# Patient Record
Sex: Female | Born: 1945 | Race: White | Hispanic: No | Smoking: Former smoker
Health system: Southern US, Community
[De-identification: ages and names within clinical notes are randomized; demographics above are authoritative.]

## PROBLEM LIST (undated history)

## (undated) DIAGNOSIS — M4800 Spinal stenosis, site unspecified: Secondary | ICD-10-CM

## (undated) DIAGNOSIS — J449 Chronic obstructive pulmonary disease, unspecified: Secondary | ICD-10-CM

## (undated) DIAGNOSIS — M1990 Unspecified osteoarthritis, unspecified site: Secondary | ICD-10-CM

## (undated) DIAGNOSIS — F329 Major depressive disorder, single episode, unspecified: Secondary | ICD-10-CM

## (undated) DIAGNOSIS — G609 Hereditary and idiopathic neuropathy, unspecified: Secondary | ICD-10-CM

## (undated) DIAGNOSIS — C801 Malignant (primary) neoplasm, unspecified: Secondary | ICD-10-CM

## (undated) DIAGNOSIS — F32A Depression, unspecified: Secondary | ICD-10-CM

## (undated) DIAGNOSIS — I10 Essential (primary) hypertension: Secondary | ICD-10-CM

## (undated) HISTORY — DX: Hereditary and idiopathic neuropathy, unspecified: G60.9

## (undated) HISTORY — DX: Spinal stenosis, site unspecified: M48.00

## (undated) HISTORY — DX: Chronic obstructive pulmonary disease, unspecified: J44.9

## (undated) HISTORY — PX: COSMETIC SURGERY: SHX468

## (undated) HISTORY — DX: Malignant (primary) neoplasm, unspecified: C80.1

## (undated) HISTORY — DX: Essential (primary) hypertension: I10

## (undated) HISTORY — DX: Unspecified osteoarthritis, unspecified site: M19.90

---

## 1998-04-13 ENCOUNTER — Other Ambulatory Visit: Admission: RE | Admit: 1998-04-13 | Discharge: 1998-04-13 | Payer: Self-pay | Admitting: *Deleted

## 1998-04-22 ENCOUNTER — Other Ambulatory Visit: Admission: RE | Admit: 1998-04-22 | Discharge: 1998-04-22 | Payer: Self-pay | Admitting: *Deleted

## 1998-10-18 ENCOUNTER — Ambulatory Visit (HOSPITAL_COMMUNITY): Admission: RE | Admit: 1998-10-18 | Discharge: 1998-10-18 | Payer: Self-pay | Admitting: *Deleted

## 1999-06-13 ENCOUNTER — Other Ambulatory Visit: Admission: RE | Admit: 1999-06-13 | Discharge: 1999-06-13 | Payer: Self-pay | Admitting: *Deleted

## 1999-11-30 ENCOUNTER — Encounter: Payer: Self-pay | Admitting: *Deleted

## 1999-11-30 ENCOUNTER — Ambulatory Visit (HOSPITAL_COMMUNITY): Admission: RE | Admit: 1999-11-30 | Discharge: 1999-11-30 | Payer: Self-pay | Admitting: *Deleted

## 2000-12-05 ENCOUNTER — Ambulatory Visit (HOSPITAL_COMMUNITY): Admission: RE | Admit: 2000-12-05 | Discharge: 2000-12-05 | Payer: Self-pay | Admitting: *Deleted

## 2000-12-12 ENCOUNTER — Encounter: Payer: Self-pay | Admitting: Family Medicine

## 2000-12-12 ENCOUNTER — Ambulatory Visit (HOSPITAL_COMMUNITY): Admission: RE | Admit: 2000-12-12 | Discharge: 2000-12-12 | Payer: Self-pay | Admitting: Family Medicine

## 2002-01-08 ENCOUNTER — Encounter: Payer: Self-pay | Admitting: Family Medicine

## 2002-01-08 ENCOUNTER — Ambulatory Visit (HOSPITAL_COMMUNITY): Admission: RE | Admit: 2002-01-08 | Discharge: 2002-01-08 | Payer: Self-pay | Admitting: Family Medicine

## 2002-03-07 ENCOUNTER — Encounter: Payer: Self-pay | Admitting: Family Medicine

## 2002-03-07 ENCOUNTER — Ambulatory Visit (HOSPITAL_COMMUNITY): Admission: RE | Admit: 2002-03-07 | Discharge: 2002-03-07 | Payer: Self-pay | Admitting: Family Medicine

## 2003-02-03 ENCOUNTER — Ambulatory Visit (HOSPITAL_COMMUNITY): Admission: RE | Admit: 2003-02-03 | Discharge: 2003-02-03 | Payer: Self-pay | Admitting: Family Medicine

## 2003-02-03 ENCOUNTER — Encounter: Payer: Self-pay | Admitting: Family Medicine

## 2003-11-02 ENCOUNTER — Other Ambulatory Visit: Admission: RE | Admit: 2003-11-02 | Discharge: 2003-11-02 | Payer: Self-pay | Admitting: Internal Medicine

## 2004-04-04 ENCOUNTER — Inpatient Hospital Stay (HOSPITAL_COMMUNITY): Admission: EM | Admit: 2004-04-04 | Discharge: 2004-04-06 | Payer: Self-pay | Admitting: Emergency Medicine

## 2004-04-19 ENCOUNTER — Other Ambulatory Visit: Admission: RE | Admit: 2004-04-19 | Discharge: 2004-04-19 | Payer: Self-pay | Admitting: Obstetrics and Gynecology

## 2004-04-21 ENCOUNTER — Ambulatory Visit (HOSPITAL_COMMUNITY): Admission: RE | Admit: 2004-04-21 | Discharge: 2004-04-21 | Payer: Self-pay | Admitting: Family Medicine

## 2005-01-17 ENCOUNTER — Ambulatory Visit: Payer: Self-pay | Admitting: Internal Medicine

## 2005-07-07 ENCOUNTER — Ambulatory Visit (HOSPITAL_COMMUNITY): Admission: RE | Admit: 2005-07-07 | Discharge: 2005-07-07 | Payer: Self-pay | Admitting: Family Medicine

## 2005-08-01 ENCOUNTER — Encounter (HOSPITAL_COMMUNITY): Admission: RE | Admit: 2005-08-01 | Discharge: 2005-10-30 | Payer: Self-pay | Admitting: Internal Medicine

## 2006-06-18 ENCOUNTER — Ambulatory Visit: Payer: Self-pay | Admitting: Internal Medicine

## 2006-10-04 ENCOUNTER — Encounter: Admission: RE | Admit: 2006-10-04 | Discharge: 2006-10-04 | Payer: Self-pay | Admitting: Family Medicine

## 2007-08-28 ENCOUNTER — Ambulatory Visit: Payer: Self-pay | Admitting: Vascular Surgery

## 2008-04-09 ENCOUNTER — Ambulatory Visit (HOSPITAL_COMMUNITY): Admission: RE | Admit: 2008-04-09 | Discharge: 2008-04-09 | Payer: Self-pay | Admitting: Family Medicine

## 2009-07-12 DIAGNOSIS — J441 Chronic obstructive pulmonary disease with (acute) exacerbation: Secondary | ICD-10-CM | POA: Insufficient documentation

## 2009-07-12 DIAGNOSIS — I10 Essential (primary) hypertension: Secondary | ICD-10-CM | POA: Insufficient documentation

## 2009-07-13 ENCOUNTER — Ambulatory Visit: Payer: Self-pay | Admitting: Internal Medicine

## 2009-07-13 DIAGNOSIS — R635 Abnormal weight gain: Secondary | ICD-10-CM | POA: Insufficient documentation

## 2010-05-11 ENCOUNTER — Ambulatory Visit (HOSPITAL_COMMUNITY): Admission: RE | Admit: 2010-05-11 | Discharge: 2010-05-11 | Payer: Self-pay | Admitting: Family Medicine

## 2010-08-14 ENCOUNTER — Encounter: Payer: Self-pay | Admitting: Family Medicine

## 2010-11-16 ENCOUNTER — Ambulatory Visit (HOSPITAL_COMMUNITY)
Admission: RE | Admit: 2010-11-16 | Discharge: 2010-11-16 | Disposition: A | Discharge status: Home / Self Care | Payer: Medicare Other | Source: Ambulatory Visit | Attending: Family Medicine | Admitting: Family Medicine

## 2010-11-16 ENCOUNTER — Ambulatory Visit (HOSPITAL_COMMUNITY): Payer: Medicare Other

## 2010-11-16 DIAGNOSIS — R209 Unspecified disturbances of skin sensation: Secondary | ICD-10-CM | POA: Insufficient documentation

## 2010-11-16 DIAGNOSIS — M79609 Pain in unspecified limb: Secondary | ICD-10-CM

## 2010-12-06 NOTE — Procedures (Signed)
DUPLEX DEEP VENOUS EXAM - LOWER EXTREMITY   INDICATION:  Left calf edema and pain.   HISTORY:  Edema:  One day of swelling and heat in the left calf.  Trauma/Surgery:  No.  Pain:  One month of left calf pain.  PE:  No.  Previous DVT:  No.  Anticoagulants:  Aspirin daily.  Other:  Bilateral lower extremity varicose veins.  Patient was recently  diagnosed with a bone spur in the left leg.   DUPLEX EXAM:                CFV   SFV   PopV  PTV    GSV                R  L  R  L  R  L  R   L  R  L  Thrombosis    O  o     o     o      o     o  Spontaneous   +  +     +     +      +     +  Phasic        +  +     +     +      +     +  Augmentation  +  +     +     +      +     +  Compressible  +  +     +     +      +     +  Competent     0  +     +     +      +     +   Legend:  + - yes  o - no  p - partial  D - decreased   IMPRESSION:  1. No evidence of left lower extremity deep venous thrombosis,      superficial vein thrombosis, or baker's cyst.  2. 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    _____________________________  Larina Earthly, M.D.   MC/MEDQ  D:  08/28/2007  T:  08/29/2007  Job:  [id number]

## 2010-12-09 NOTE — Assessment & Plan Note (Signed)
[facility]                             PULMONARY OFFICE NOTE   NAME:Frye, Susan SHELLHAMMER                       MRN:          [id number]  DATE:06/18/2006                            DOB:          19-Dec-1945    HISTORY:  This is a 65 year old white female who quit smoking  successfully in September of 2005 but has not been seen here in several  years.  Her baseline is that she is able to walk up a flight of steps  before having to stop at the top due to dyspnea, but is able to walk  flat okay despite the fact that she has gained almost 40 pounds since  she stopped smoking.  She comes in today because she is acutely worse  over the last 3 days with increasing dyspnea just walking on a flat  surface, and states she is back to wheeze and a hacking cough productive  of yellow sputum, a scratchy throat, but no fever, chills, pleuritic or  exertional chest pain, orthopnea, PND, or leg swelling.   For full inventory of medications, please see face sheet dated June 18, 2006.   PHYSICAL EXAMINATION:  She is a pleasant, ambulatory white female in no  acute distress.  She is quite hoarse.  VITAL SIGNS:  Stable vital signs.  HEENT:  Unremarkable.  Oropharynx is clear except for minimal  cobblestoning.  No significant erythema, purulent post-nasal drainage.  NECK:  Supple without cervical adenopathy or tenderness. Trachea is  midline.  No thyromegaly.  LUNGS:  Fields reveal more upper than lower airway wheezing.  CARDIAC:  Regular rate and rhythm without murmur, gallop, or rub.  ABDOMEN:  Soft and benign.  EXTREMITIES:  Warm without calf tenderness, cyanosis, clubbing, or  edema.   Heme saturation 93% on room air.   She tells me she had a chest x-ray within the last 3 months at  [facility], which was felt to be normal.   IMPRESSION:  1. Chronic obstructive pulmonary disease status post smoking cessation      successful 2 years ago with  progressive weight gain that is      probably more to do with why she has noticed decreased exercise      tolerance than actual progression of air flow obstruction,      especially since she is on Advair, which should have eliminated any      asthmatic component or progression in terms of the inflammatory      component of her problem.  I have asked her to return within 6      weeks for PFTs to compare to previous studies to see if anything      else needs to be done.  2. To treat her acute exacerbation today, which is apparently due to      tracheobronchitis with asthmatic bronchitis, I have recommended a 6      day course with prednisone, along with a 7 day course of      Doxycycline, and reminded her how to use her p.r.n. Combivent 2  puffs every 4 hours.  For now, I would like her to stay on Advair.      I did ask her to add Zegerid at bedtime because of all the      hoarseness that she is having, and reminded her regarding optimal      Advair technique to reduce deposition to the upper airway.  3. For cough, I would like her to use Mucinex DM 1 to 2 every 12      hours, and for severe cough, to add tramadol 50 mg 1 q.4 p.r.n. to      the Advair.     Charlaine Dalton. Sherene Sires, MD, [facility]  Electronically Signed    MBW/MedQ  DD: 06/18/2006  DT: 06/18/2006  Job #: [id number]   cc:   Marjory Lies, M.D.

## 2010-12-09 NOTE — Cardiovascular Report (Signed)
[facility]  Patient:    Susan Frye, Susan Frye                         MRN: [id number] Adm. Date:  [id number] Attending:  Glennon Hamilton CC:         Charlaine Dalton. Sherene Sires, M.D. [facility]  Luis Abed, M.D. [facility]   Cardiac Catheterization  INDICATIONS:  The patient is a 65 year old white female with multiple risk factors including family history and cigarettes presenting with unstable angina.  PROCEDURE:  Selective coronary angiography, left ventricular angiography-Judkins technique.  RESULTS:  LV systolic 145, diastolic 25.  AO systolic 145, diastolic 73.  ANGIOGRAPHY: 1. The left main coronary artery is normal. 2. The left anterior descending artery had mild calcium proximally with some    irregularities proximally with a 30% lesion in the mid LAD. 3. The circumflex is normal. 4. The right coronary artery is normal. 5. The left ventricle reveals normal EF.  There is focal hypokinesis in the    mid inferior wall.  SUMMARY: 1. There is no significant coronary artery disease.  There is some calcium and    a lesion of 30% or less in the mid LAD. 2. The circumflex and RCA are normal. 3. The LV is normal although there is a focal area of mild hypokinesis of the    mid inferior wall.  The patient will be continued on a medical regimen. DD:  12/05/00 TD:  12/05/00 Job: [id number]

## 2010-12-09 NOTE — Discharge Summary (Signed)
NAME:  Susan Frye, Susan Frye                          ACCOUNT NO.:  [id number]   MEDICAL RECORD NO.:  [id number]                   PATIENT TYPE:  INP   LOCATION:  5737                                 FACILITY:  [facility]   PHYSICIAN:  Rosanne Sack, M.D.         DATE OF BIRTH:  Aug 23, 1945   DATE OF ADMISSION:  04/03/2004  DATE OF DISCHARGE:  04/06/2004                                 DISCHARGE SUMMARY   CHIEF COMPLAINT:  Shortness of breath.   DISCHARGE DIAGNOSES:  1.  Bilateral community acquired pneumonia greatest right upper lobe with      acute exacerbation of her chronic obstructive pulmonary disease.      1.  Acute exacerbation chronic obstructive pulmonary disease with          hypoxemia, leukocytosis, complaints shortness of breath.      2.  CT scan of the chest done April 04, 2004 noted stable right          middle lobe calcified nodule.  CT suggested bilateral pneumonia          greatest right upper lobe.      3.  Leukocytosis improving prior to discharge 13.3 in spite steroid          taper.      4.  Nasal cannula oxygen requirement resolved.  O2 saturations 93% on          room air prior to discharge.      5.  Wheezing resolved, steroid taper.      6.  Antibiotic therapy continued total 14 day course post discharge.      7.  Previously seen in consult, Dr. Sherene Sires, pulmonology.  2.  Tobacco abuse smoking one pack per day lifelong.      1.  Initiated on nicotine patch with patient to continue post discharge          and wean per manufacturer's instructions.      2.  Smoking cessation consult requested, but unfortunately not obtained          prior to discharge.      3.  Follow up at [facility] in regards to smoking          cessation.  3.  Chronic obstructive pulmonary disease with likely reactive component.      1.  History childhood asthma.      2.  Initiated on steroid taper over the course of hospitalization          secondary to wheezing with  resolution prior to discharge.      3.  Initiated on Advair Diskus for home use.  4.  Mild hyperglycemia thought secondary to prednisone steroid taper.      Glucose 125-159 range.      1.  Positive family history of diabetes.      2.  No indication of active diabetes in this patient.  Hemoglobin A1C  5.9.  5.  Borderline hypertension.      1.  Felt likely exacerbated by steroids.      2.  Restarted previous home medication Avalide at previous home dosage.      3.  Follow up blood pressures post discharge at [facility].  6.  History depression.  Restarted previous home SSRI.  7.  Allergies listed no known drug allergies.  8.  Code status was full code over the course of hospitalization.   DISCHARGE MEDICATIONS:  1.  Avalide 12.5/150 mg one tablet daily as previous.  2.  Combivent metered dose inhaler 120/21 mcg two puffs t.i.d. routinely for      seven days and then two puffs q.i.d. as needed for shortness of breath      following seven days of routine use.  3.  Azithromycin 250 mg daily for 10 days post discharge.  4.  Augmentin 875 mg b.i.d. for 10 days post discharge.  5.  Prednisone taper 40 mg daily for three days, then 20 mg daily for three      days, then 10 mg daily for three days, then discontinue.  6.  Nicotine patch 14 mg topically and change once daily for 30 days, then 7      mg topically and change daily for 30 days, then discontinue.  Patient      instructed not to use these patches if she resumes cigarette smoking.  7.  Advair Diskus 250/50 mg one puff b.i.d. routinely.  8.  Paxil 20 mg daily as previous home medication.  9.  Tylenol 650 mg q.4h. as needed for mild pain or fever.  10. Laxative of choice daily as needed for constipation.   SPECIAL DISCHARGE INSTRUCTIONS:  1.  The patient may be off work until Monday, April 11, 2004 with excuse      provided.  2.  Follow up [facility] within next week.  3.   Activity as tolerated without restriction.  4.  Discharge diet low sodium.   CONSULTS:  None.   PROCEDURE:  CT scan of the chest done April 04, 2004 with results as  above.   DISCHARGE LABORATORIES:  CBC done April 06, 2004 found WBC elevated,  though improving 13.3, hemoglobin 14.1, hematocrit 40.5, platelets 184.  Hemoglobin A1C 5.9.  Sputum culture preliminary rare gram-positive rods and  rare gram-positive cocci in pairs and in clusters with final report pending.  CT scan of the chest April 04, 2004 noted heavily calcified right middle  lobe nodules, appear stable, bilateral pneumonia most prominent right upper  lobe, hilar and mediastinal adenopathy, likely reactive, coronary artery  calcification.  Chest x-ray April 03, 2004 notes cardiomegaly with  interstitial pulmonary edema, 12 mm nodule right lung may represent  granuloma.  Recommend CT scan.  Hilar prominence could not be assessed by  comparison with earlier films.  Hemoglobin A1C was unremarkable 5.9.   HISTORY OF PRESENT ILLNESS:  This 65 year old female has known COPD.  She is  followed by Dr. Doristine Counter at [facility] in primary care.  She  complained of increased shortness of breath and onset myalgias, fatigue,  fever, chills.  Her shortness of breath worsened and was experienced on  exertion as well as rest.  She had a productive cough with wheezing.  She  came to the emergency room and received a nebulizer treatment.  She was  admitted for further evaluation and treatment.  For written note, family history, social history, review of systems,  physical examination, admission medications, laboratory data, please see  dictated admission history and physical dated April 03, 2004 (written  note).   HOSPITAL COURSE:  #1 - BILATERAL COMMUNITY ACQUIRED PNEUMONIA RIGHT GREATER  THAN LEFT WITH SUBSEQUENT ACUTE EXACERBATION CHRONIC OBSTRUCTIVE PULMONARY DISEASE:  Patient presented as per  history of present illness.  Chest x-ray  was nondefinitive.  A CT scan of the chest was obtained April 04, 2004  with report as above.  Essentially, suggested bilateral pneumonia greater  right upper lobe.  Patient had experienced hypoxemia and she was started on  nasal cannula oxygen.  There was noted presence of wheezing and she was  started on oral prednisone.  There was leukocytosis and fever.  She was  initiated on Rocephin and azithromycin IV antibiotics.  The patient's  condition gradually improved over the course of hospitalization.  Rocephin  IV was changed to Augmentin and azithromycin changed to oral form.  Wheezing  disappeared with steroids and taper was initiated.  Patient utilized no home  steroids or oxygen.  On day of discharge her O2 saturations remained stable  93% on room air.  She will continue antibiotics for a total 14-day course  post discharge.  Will continue to wean prednisone over the next several days  to discontinuance.  She is to follow up in the next week with [facility].  She should be off work until next Monday, April 11, 2004 to recuperate.  No other activity restrictions.   #2 - TOBACCO ABUSE:  Patient does smoke one pack per day lifelong.  She was  smoking up to this hospitalization.  I placed her on 14 mg nicotine patch  and she has done quite well with this.  She continues to state she wishes to  quit smoking.  She will continue on the 14 mg nicotine patch post discharge,  then taper to discontinuance per manufacturer's instructions going to 7 mg  patch next period.  She should follow up in regards to smoking cessation  with [facility].  She is instructed not to continue the  patches if she resumes smoking.   #3 - ADVANCED CHRONIC OBSTRUCTIVE PULMONARY DISEASE WITH LIKELY REACTIVE  AIRWAY COMPONENT:  Again, significant wheezing on admission, resolved with  oral prednisone.  Patient does have a history of  childhood asthma.  I placed  her on Advair Diskus and have continued this post discharge.  I have also  continued her Combivent metered dose inhaler which she is to use routinely  for one week post discharge and then taper this to q.i.d. as needed.   #4 - MILD HYPERGLYCEMIA:  This noted over the course of hospitalization with  serum glucose in the 125-159 range.  She does have a family history of  diabetes and I obtained hemoglobin A1C which is quite normal at 5.9.  Hyperglycemia thought secondary to her steroids and I suspect no underlying  diabetes at this point.   #5 - BORDERLINE HYPERTENSION:  Blood pressures ranged in the 130-150 range  systolic, likely some exacerbation from her prednisone.  Patient was  routinely on Avalide 12.5/150 mg daily and I have resumed this post  discharge.   #6 - DEPRESSION:  Patient has a history of depression.  Was managed on Paxil  20 mg daily.  I have resumed this at discharge.  #7 - CODE STATUS:  Patient was full code over the course of  hospitalization.   DISPOSITION:  Discharged home where she lives independently with her  husband.  Follow up as above.   ACTIVITY:  No restrictions, though light duty and home rest with no  resumption of work until April 11, 2004.   DISCHARGE DIET:  Low sodium secondary to history of hypertension.   CONDITION ON DISCHARGE:  Stable/improved, though with multiple medical  problems as above.   DISCHARGE PROCESS:  Greater than 30 minutes 3:30 p.m. through 4:15 p.m.      Everett Graff, N.P.                     Rosanne Sack, M.D.   TC/MEDQ  D:  04/06/2004  T:  04/06/2004  Job:  [id number]   cc:   Marjory Lies, M.D.  [id number]  [place]  [place] [address]  Fax: [phone]

## 2011-04-19 ENCOUNTER — Other Ambulatory Visit (HOSPITAL_COMMUNITY): Payer: Self-pay | Admitting: Family Medicine

## 2011-04-19 DIAGNOSIS — Z1231 Encounter for screening mammogram for malignant neoplasm of breast: Secondary | ICD-10-CM

## 2011-05-01 ENCOUNTER — Encounter: Payer: Self-pay | Admitting: Internal Medicine

## 2011-05-02 ENCOUNTER — Ambulatory Visit (INDEPENDENT_AMBULATORY_CARE_PROVIDER_SITE_OTHER): Payer: Medicare Other | Admitting: Internal Medicine

## 2011-05-02 ENCOUNTER — Encounter: Payer: Self-pay | Admitting: Internal Medicine

## 2011-05-02 ENCOUNTER — Ambulatory Visit (INDEPENDENT_AMBULATORY_CARE_PROVIDER_SITE_OTHER)
Admission: RE | Admit: 2011-05-02 | Discharge: 2011-05-02 | Disposition: A | Discharge status: Home / Self Care | Payer: Medicare Other | Source: Ambulatory Visit | Attending: Internal Medicine | Admitting: Internal Medicine

## 2011-05-02 VITALS — BP 136/52 | HR 95 | Temp 98.4°F | Ht 72.0 in | Wt 261.0 lb

## 2011-05-02 DIAGNOSIS — J449 Chronic obstructive pulmonary disease, unspecified: Secondary | ICD-10-CM

## 2011-05-02 MED ORDER — TIOTROPIUM BROMIDE MONOHYDRATE 18 MCG IN CAPS: 18.0000 ug | ORAL_CAPSULE | Freq: Every day | RESPIRATORY_TRACT | Status: DC

## 2011-05-02 NOTE — Patient Instructions (Addendum)
spiriva one capsule each am first thing in am  Only use your combivent  as a rescue medication to be used if you can't catch your breath by resting or doing a relaxed purse lip breathing pattern. The less you use it, the better it will work when you need it.   Please schedule a follow up office visit in 4 weeks, sooner if needed with pft's on return

## 2011-05-02 NOTE — Progress Notes (Signed)
Subjective:     Patient ID: Susan Frye, female   DOB: 06-23-1946, 65 y.o.   MRN: [id number]  HPI     10 yowf quit smokng 03/2004 with slow downhill doe with minimal variability baseline = across parking lot without stopping to point where needs hc parking associated with about 35 lbs of wt gain.   July 13, 2009  Cough. Pt last seen in November 2007. Pt c/o cough x 1 month. Cough is prod with clear sputum and worsens at night. She feels like she has some PND. She states that she still has SOB with exertion- same as when seen in 2007. worse after several hours sleeping, occ overt hb, not using ppi consistently. no purulent secretions.  rec   Acid reflux diet   Take prednisone for 6 days    Start Symbicort 2 puffs first thing in am and 2 puffs again in pm about 12 hours later    Return next available lung function    05/02/2011 f/u ov/Alliene Klugh cc doe x shopping at [employer] leaning on cart x years, indolent,  Slowly progressive, temporarily improves with combivent. Minimal cough.  Sleeping ok without nocturnal  or early am exacerbation  of respiratory  c/o's or need for noct saba. Also denies any obvious fluctuation of symptoms with weather or environmental changes or other aggravating or alleviating factors except as outlined above    ROS  At present neg for  any significant sore throat, dysphagia, itching, sneezing,  nasal congestion or excess/ purulent secretions,  fever, chills, sweats, unintended wt loss, pleuritic or exertional cp, hempoptysis, orthopnea pnd or leg swelling.  Also denies presyncope, palpitations, heartburn, abdominal pain, nausea, vomiting, diarrhea  or change in bowel or urinary habits, dysuria,hematuria,  rash, arthralgias, visual complaints, headache, numbness weakness or ataxia.      Past Medical History:  HYPERTENSION (ICD-401.9)  COPD (ICD-496)  - PFT's 12/10/03 FEV1 1.12 (36%) ratio 49  - HFA 75% July 13, 2009    Family History:  Emphysema-  Mother and Father (both were smokers)  Asthma- Sister  Heart dz- Father  Lung CA- Mother (was a smoker)   Social History:  Married  Children  Former smoker. Quit in 2005. Smoked up to 2 ppd x 39 years.  Social ETOH  Manager        Review of Systems     Objective:   Physical Exam   wt 295 July 13, 2009 vs 05/02/2011  261 amb anxious mod obese wf nad  HEENT mild turbinate edema. Oropharynx no thrush or excess pnd or cobblestoning. No JVD or cervical adenopathy. Mild accessory muscle hypertrophy. Trachea midline, nl thryroid. Chest was hyperinflated by percussion with diminished breath sounds and moderate increased exp time without wheeze. Hoover sign positive at mid inspiration. Regular rate and rhythm without murmur gallop or rub or increase P2 or edema. Abd: no hsm, nl excursion. Ext warm without cyanosis or clubbing.     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 Stable scarring left mid lung. No acute abnormalities.    Assessment:          Plan:

## 2011-05-02 NOTE — Assessment & Plan Note (Addendum)
When respiratory symptoms progress well after a patient reports complete smoking cessation,  it is very hard to "blame" COPD specifically or airways disorders in general  ie it doesn't make any more sense than hearing a  [employer] driver wrecked his car while driving his kids to school or a surgeon sliced his hand off carving roast beef (it must be rare indeed!)     That is to say, once the high risk activity stops,  the symptoms should not suddenly erupt or markedly worsen.  If so, the differential diagnosis should include  obesity/deconditioning,  LPR/Reflux/Aspiration syndromes,  occult CHF, or  especially side effect of medications commonly used in this population.    Not really clear she's worse and may benefit from rehab.  Will try spiriva then regroup with pft's  See instructions for specific recommendations which were reviewed directly with the patient who was given a copy with highlighter outlining the key components.   The proper method of use, as well as anticipated side effects, of this DPI/MDI are discussed and demonstrated to the patient. Improved to 90% with coaching.

## 2011-05-03 NOTE — Progress Notes (Signed)
Quick Note:  Spoke with pt and notified of results per Dr. Wert. Pt verbalized understanding and denied any questions.  ______ 

## 2011-05-15 ENCOUNTER — Ambulatory Visit (HOSPITAL_COMMUNITY): Payer: Medicare Other

## 2011-05-31 ENCOUNTER — Ambulatory Visit (INDEPENDENT_AMBULATORY_CARE_PROVIDER_SITE_OTHER): Payer: Medicare Other | Admitting: Internal Medicine

## 2011-05-31 ENCOUNTER — Encounter: Payer: Self-pay | Admitting: Internal Medicine

## 2011-05-31 DIAGNOSIS — J449 Chronic obstructive pulmonary disease, unspecified: Secondary | ICD-10-CM

## 2011-05-31 DIAGNOSIS — R635 Abnormal weight gain: Secondary | ICD-10-CM

## 2011-05-31 LAB — PULMONARY FUNCTION TEST

## 2011-05-31 MED ORDER — LEVALBUTEROL TARTRATE 45 MCG/ACT IN AERO: 1.0000 | INHALATION_SPRAY | RESPIRATORY_TRACT | Status: DC | PRN

## 2011-05-31 NOTE — Patient Instructions (Signed)
Stop combivent.  Use xopenex in place of combivent  But only as a rescue medication to be used if you can't catch your breath by resting or doing a relaxed purse lip breathing pattern. The less you use it, the better it will work when you need it.    If you are satisfied with your treatment plan let your doctor know and he/she can either refill your medications or you can return here when your prescription runs out.     If in any way you are not 100% satisfied,  please tell us.  If 100% better, tell your friends!

## 2011-05-31 NOTE — Assessment & Plan Note (Signed)
I had an extended summary discussion with the patient today lasting 15 to 20 minutes of a 25 minute visit on the following issues:   I reviewed the Flethcher curve with patient that basically indicates  if you quit smoking when your best day FEV1 is still well preserved it is highly unlikely you will progress to severe disease and informed the patient there was no medication on the market that has proven to change the curve or the likelihood of progression.  Therefore   maintaining abstinence from smoking is the most important aspect of care, not choice of inhalers or for that matter, doctors.   F/u here can be prn

## 2011-05-31 NOTE — Assessment & Plan Note (Signed)
Marked improvement with voluntary wt loss noted.

## 2011-05-31 NOTE — Progress Notes (Signed)
PFT was done today.  

## 2011-05-31 NOTE — Progress Notes (Signed)
Subjective:     Patient ID: Susan Frye, female   DOB: [birth year], 65 y.o.   MRN: [id number]  HPI     62 yowf quit smokng 03/2004 with slow downhill doe with minimal variability baseline = across parking lot without stopping to point where needs hc parking associated with about 35 lbs of wt gain.   July 13, 2009  Cough. Pt last seen in November 2007. Pt c/o cough x 1 month. Cough is prod with clear sputum and worsens at night. She feels like she has some PND. She states that she still has SOB with exertion- same as when seen in 2007. worse after several hours sleeping, occ overt hb, not using ppi consistently. no purulent secretions.  rec   Acid reflux diet   Take prednisone for 6 days    Start Symbicort 2 puffs first thing in am and 2 puffs again in pm about 12 hours later    Return next available lung function    05/02/2011 f/u ov/Nazario Russom cc doe x shopping at [employer] leaning on cart x years, indolent,  Slowly progressive, temporarily improves with combivent. Minimal cough. rec spiriva one capsule each am first thing in am Only use your combivent  as a rescue medication to be used if you can't catch your breath by resting or doing a relaxed purse lip breathing pattern. The less you use it, the better it will work when you need it   05/31/2011 f/u ov/Elbert Polyakov cc doe better to her satisfaction on spiriva and rarely need combivent and minimal dry cough.   Sleeping ok without nocturnal  or early am exacerbation  of respiratory  c/o's or need for noct saba. Also denies any obvious fluctuation of symptoms with weather or environmental changes or other aggravating or alleviating factors except as outlined above    ROS  At present neg for  any significant sore throat, dysphagia, itching, sneezing,  nasal congestion or excess/ purulent secretions,  fever, chills, sweats, unintended wt loss, pleuritic or exertional cp, hempoptysis, orthopnea pnd or leg swelling.  Also denies presyncope,  palpitations, heartburn, abdominal pain, nausea, vomiting, diarrhea  or change in bowel or urinary habits, dysuria,hematuria,  rash, arthralgias, visual complaints, headache, numbness weakness or ataxia.      Past Medical History:  HYPERTENSION (ICD-401.9)  COPD (ICD-496)  - PFT's 12/10/03 FEV1 1.12 (36%) ratio 49  - PFT's 05/31/2011   1.31 (46%) ratio 50 and 13% p B2 with DLC0 40% - HFA 75% July 13, 2009    Family History:  Emphysema- Mother and Father (both were smokers)  Asthma- Sister  Heart dz- Father  Lung CA- Mother (was a smoker)   Social History:  Married  Children  Former smoker. Quit in 2005. Smoked up to 2 ppd x 39 years.  Social ETOH  Manager              Objective:   Physical Exam   wt 295 July 13, 2009 vs 05/02/2011  261 > 05/31/2011  256  amb anxious mod obese wf nad  HEENT mild turbinate edema. Oropharynx no thrush or excess pnd or cobblestoning. No JVD or cervical adenopathy. Mild accessory muscle hypertrophy. Trachea midline, nl thryroid. Chest was hyperinflated by percussion with diminished breath sounds and moderate increased exp time without wheeze. Hoover sign positive at mid inspiration. Regular rate and rhythm without murmur gallop or rub or increase P2 or edema. Abd: no hsm, nl excursion. Ext warm without cyanosis or clubbing.  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 Stable scarring left mid lung. No acute abnormalities.    Assessment:          Plan:

## 2011-06-02 ENCOUNTER — Telehealth: Payer: Self-pay | Admitting: Internal Medicine

## 2011-06-02 MED ORDER — TIOTROPIUM BROMIDE MONOHYDRATE 18 MCG IN CAPS: 18.0000 ug | ORAL_CAPSULE | Freq: Every day | RESPIRATORY_TRACT | Status: DC

## 2011-06-02 NOTE — Telephone Encounter (Signed)
Patient needing sprivia not symbicort.  [facility] [place]

## 2011-06-02 NOTE — Telephone Encounter (Signed)
Susan Frye spoke with pt and made her aware that the rx for the spiriva has been sent to her pharmacy.

## 2011-06-02 NOTE — Telephone Encounter (Signed)
?   Why need for symbicort, this is not on our med list. LMTCB.

## 2011-06-05 ENCOUNTER — Telehealth: Payer: Self-pay | Admitting: Internal Medicine

## 2011-06-05 NOTE — Telephone Encounter (Signed)
Spoke with pt. She states that she is in the donut hole already and that 1 couple of boxes of spiriva would help. 2 boxes left up front for pick up.

## 2011-06-05 NOTE — Telephone Encounter (Signed)
ATC x 2 line busy, WCB 

## 2011-06-07 ENCOUNTER — Ambulatory Visit (HOSPITAL_COMMUNITY)
Admission: RE | Admit: 2011-06-07 | Discharge: 2011-06-07 | Disposition: A | Discharge status: Home / Self Care | Payer: Medicare Other | Source: Ambulatory Visit | Attending: Family Medicine | Admitting: Family Medicine

## 2011-06-07 DIAGNOSIS — Z1231 Encounter for screening mammogram for malignant neoplasm of breast: Secondary | ICD-10-CM | POA: Insufficient documentation

## 2011-09-05 ENCOUNTER — Other Ambulatory Visit: Payer: Self-pay | Admitting: Family Medicine

## 2011-09-05 DIAGNOSIS — N63 Unspecified lump in unspecified breast: Secondary | ICD-10-CM

## 2011-09-12 ENCOUNTER — Ambulatory Visit
Admission: RE | Admit: 2011-09-12 | Discharge: 2011-09-12 | Disposition: A | Discharge status: Home / Self Care | Payer: Medicare Other | Source: Ambulatory Visit | Attending: Family Medicine | Admitting: Family Medicine

## 2011-09-12 DIAGNOSIS — N63 Unspecified lump in unspecified breast: Secondary | ICD-10-CM

## 2011-11-16 ENCOUNTER — Other Ambulatory Visit: Payer: Self-pay | Admitting: Sports Medicine

## 2011-11-16 DIAGNOSIS — M5137 Other intervertebral disc degeneration, lumbosacral region: Secondary | ICD-10-CM

## 2011-11-19 ENCOUNTER — Ambulatory Visit
Admission: RE | Admit: 2011-11-19 | Discharge: 2011-11-19 | Disposition: A | Discharge status: Home / Self Care | Payer: Medicare Other | Source: Ambulatory Visit | Attending: Sports Medicine | Admitting: Sports Medicine

## 2011-11-19 DIAGNOSIS — M5137 Other intervertebral disc degeneration, lumbosacral region: Secondary | ICD-10-CM

## 2012-03-23 ENCOUNTER — Other Ambulatory Visit: Payer: Self-pay | Admitting: Internal Medicine

## 2012-03-28 ENCOUNTER — Other Ambulatory Visit: Payer: Self-pay | Admitting: Internal Medicine

## 2012-03-29 ENCOUNTER — Other Ambulatory Visit: Payer: Self-pay | Admitting: Internal Medicine

## 2012-05-27 ENCOUNTER — Other Ambulatory Visit: Payer: Self-pay | Admitting: Internal Medicine

## 2012-06-28 ENCOUNTER — Other Ambulatory Visit: Payer: Self-pay | Admitting: Internal Medicine

## 2012-08-09 ENCOUNTER — Other Ambulatory Visit: Payer: Self-pay | Admitting: Internal Medicine

## 2012-12-30 ENCOUNTER — Other Ambulatory Visit (HOSPITAL_COMMUNITY): Payer: Self-pay | Admitting: Neurosurgery

## 2012-12-30 DIAGNOSIS — M542 Cervicalgia: Secondary | ICD-10-CM

## 2013-01-02 ENCOUNTER — Ambulatory Visit (HOSPITAL_COMMUNITY)
Admission: RE | Admit: 2013-01-02 | Discharge: 2013-01-02 | Disposition: A | Discharge status: Home / Self Care | Payer: Medicare Other | Source: Ambulatory Visit | Attending: Neurosurgery | Admitting: Neurosurgery

## 2013-01-02 DIAGNOSIS — M545 Low back pain, unspecified: Secondary | ICD-10-CM | POA: Insufficient documentation

## 2013-01-02 DIAGNOSIS — M5126 Other intervertebral disc displacement, lumbar region: Secondary | ICD-10-CM | POA: Insufficient documentation

## 2013-01-02 DIAGNOSIS — M542 Cervicalgia: Secondary | ICD-10-CM | POA: Insufficient documentation

## 2013-01-02 DIAGNOSIS — M47817 Spondylosis without myelopathy or radiculopathy, lumbosacral region: Secondary | ICD-10-CM | POA: Insufficient documentation

## 2013-01-02 DIAGNOSIS — R209 Unspecified disturbances of skin sensation: Secondary | ICD-10-CM | POA: Insufficient documentation

## 2013-01-02 DIAGNOSIS — M5380 Other specified dorsopathies, site unspecified: Secondary | ICD-10-CM | POA: Insufficient documentation

## 2013-03-05 ENCOUNTER — Telehealth: Payer: Self-pay | Admitting: Internal Medicine

## 2013-03-05 NOTE — Telephone Encounter (Signed)
I spoke with pt and she stated she does not mind any provider. Please advise Dr. Vassie Loll if you would accept pt thanks

## 2013-03-05 NOTE — Telephone Encounter (Signed)
Fine with me

## 2013-03-05 NOTE — Telephone Encounter (Signed)
Called, spoke with pt - She was last seen by MW on 05/31/2011.  She would like to come in for a f/u but would like to change providers.  She doesn't have a preference on who this is.  She is aware of office protocol.  Dr. Sherene Sires, pls advise if you are ok with change.  Thank you.

## 2013-03-06 NOTE — Telephone Encounter (Signed)
I spoke with pt. She scheduled appt to see RA in October. She stated she will call if she needs [place] sooner

## 2013-03-06 NOTE — Telephone Encounter (Signed)
I can accept  - but do not double book . If she needs earlier appt, then other provider pl

## 2013-04-24 ENCOUNTER — Encounter: Payer: Self-pay | Admitting: Pulmonary Disease

## 2013-04-24 ENCOUNTER — Ambulatory Visit (INDEPENDENT_AMBULATORY_CARE_PROVIDER_SITE_OTHER)
Admission: RE | Admit: 2013-04-24 | Discharge: 2013-04-24 | Disposition: A | Discharge status: Home / Self Care | Payer: Medicare Other | Source: Ambulatory Visit | Attending: Pulmonary Disease | Admitting: Pulmonary Disease

## 2013-04-24 ENCOUNTER — Ambulatory Visit (INDEPENDENT_AMBULATORY_CARE_PROVIDER_SITE_OTHER): Payer: Medicare Other | Admitting: Pulmonary Disease

## 2013-04-24 VITALS — BP 118/62 | HR 84 | Ht 72.0 in | Wt 228.0 lb

## 2013-04-24 DIAGNOSIS — J4489 Other specified chronic obstructive pulmonary disease: Secondary | ICD-10-CM

## 2013-04-24 DIAGNOSIS — R0609 Other forms of dyspnea: Secondary | ICD-10-CM

## 2013-04-24 DIAGNOSIS — R0600 Dyspnea, unspecified: Secondary | ICD-10-CM

## 2013-04-24 DIAGNOSIS — Z23 Encounter for immunization: Secondary | ICD-10-CM

## 2013-04-24 DIAGNOSIS — J449 Chronic obstructive pulmonary disease, unspecified: Secondary | ICD-10-CM

## 2013-04-24 NOTE — Progress Notes (Signed)
  Subjective:    Patient ID: Susan Frye, female    DOB: [birth year], 67 y.o.   MRN: [id number]  HPI  9 yowf with COPD ,quit smokng 03/2004 presents for followup of dyspnea.  PFT's 12/10/03 FEV1 1.12 (36%) ratio 49  - PFT's 05/31/2011 1.31 (46%) ratio 50 and 13% p B2 with DLC0 40%  Her husband passed away in 02/15/14after a stroke and she reports mild depression since then.. She reports increased dyspnea on physical activity. She denies wheezing or frequent chest colds. He denies chest pain palpitations, orthopnea or paroxysmal nocturnal dyspnea. She has lost significant weight from 256-228 pounds in the last 2 years intentionally.  She reports chronic back pain due to spinal stenosis and surgery has been recommended by Dr. Lovell Sheehan. Chest x-ray in October 2012 showed changes of emphysema, calcified granuloma in the right middle lobe and stable scarring in the left midlung.  Spirometry today shows unchanged FEV1 at 1.28-44% and FVC of 2.09-53% with ratio 61.  She desaturated to 87% on walking second lap and recovered to 91% with an a couple of minutes. She was limited due to her legs giving out  Past Medical History  Diagnosis Date  . Hypertension   . COPD (chronic obstructive pulmonary disease)     Review of Systems  Constitutional: Negative for fever and unexpected weight change.  HENT: Negative for ear pain, nosebleeds, congestion, sore throat, rhinorrhea, sneezing, trouble swallowing, dental problem, postnasal drip and sinus pressure.   Eyes: Negative for redness and itching.  Respiratory: Positive for chest tightness and shortness of breath. Negative for cough and wheezing.   Cardiovascular: Negative for palpitations and leg swelling.  Gastrointestinal: Negative for nausea and vomiting.  Genitourinary: Negative for dysuria.  Musculoskeletal: Negative for joint swelling.  Skin: Negative for rash.  Neurological: Negative for headaches.  Hematological: Bruises/bleeds easily.   Psychiatric/Behavioral: Negative for dysphoric mood. The patient is nervous/anxious.        Objective:   Physical Exam  Gen. Pleasant, well-nourished, in no distress, normal affect ENT - no lesions, no post nasal drip Neck: No JVD, no thyromegaly, no carotid bruits Lungs: no use of accessory muscles, no dullness to percussion, clear without rales or rhonchi  Cardiovascular: Rhythm regular, heart sounds  normal, no murmurs or gallops, no peripheral edema Abdomen: soft and non-tender, no hepatosplenomegaly, BS normal. Musculoskeletal: No deformities, no cyanosis or clubbing Neuro:  alert, non focal       Assessment & Plan:

## 2013-04-24 NOTE — Assessment & Plan Note (Addendum)
Unclear cause of worsening dyspnea or last few months SPirometry showed decreasedFEV1 43% but unchanged for 2012 -She does not seem to have had COPD exacerbations. She does desaturate on exertion but is hesitant to starting oxygen. Flu shot CXR today Ambulatory satn Would benefit from pulm rehab Suggest cardiac testing - she will not be able to perform treadmill or bike exercise hence will consider myocardial perfusion imaging

## 2013-04-24 NOTE — Patient Instructions (Addendum)
SPirometry showed decreased lung capacity 43% but unchanged for 2012 Flu shot CXR today Ambulatory satn Would benefit from pulm rehab Suggest cardiac testing

## 2013-04-25 ENCOUNTER — Other Ambulatory Visit: Payer: Self-pay | Admitting: *Deleted

## 2013-04-25 ENCOUNTER — Other Ambulatory Visit: Payer: Self-pay | Admitting: Pulmonary Disease

## 2013-04-25 DIAGNOSIS — R0600 Dyspnea, unspecified: Secondary | ICD-10-CM

## 2013-05-13 ENCOUNTER — Ambulatory Visit (HOSPITAL_COMMUNITY): Payer: Medicare Other | Attending: Cardiology

## 2013-05-13 DIAGNOSIS — R0600 Dyspnea, unspecified: Secondary | ICD-10-CM

## 2013-05-13 DIAGNOSIS — R0989 Other specified symptoms and signs involving the circulatory and respiratory systems: Secondary | ICD-10-CM

## 2013-05-14 ENCOUNTER — Ambulatory Visit (HOSPITAL_COMMUNITY): Payer: Medicare Other | Attending: Cardiology | Admitting: Radiology

## 2013-05-14 VITALS — BP 151/72 | HR 76 | Ht 72.0 in | Wt 226.0 lb

## 2013-05-14 DIAGNOSIS — R0609 Other forms of dyspnea: Secondary | ICD-10-CM | POA: Insufficient documentation

## 2013-05-14 DIAGNOSIS — Z87891 Personal history of nicotine dependence: Secondary | ICD-10-CM | POA: Insufficient documentation

## 2013-05-14 DIAGNOSIS — R0600 Dyspnea, unspecified: Secondary | ICD-10-CM

## 2013-05-14 DIAGNOSIS — I10 Essential (primary) hypertension: Secondary | ICD-10-CM | POA: Insufficient documentation

## 2013-05-14 DIAGNOSIS — Z8249 Family history of ischemic heart disease and other diseases of the circulatory system: Secondary | ICD-10-CM | POA: Insufficient documentation

## 2013-05-14 DIAGNOSIS — R0989 Other specified symptoms and signs involving the circulatory and respiratory systems: Secondary | ICD-10-CM | POA: Insufficient documentation

## 2013-05-14 MED ORDER — TECHNETIUM TC 99M SESTAMIBI GENERIC - CARDIOLITE
11.0000 | Freq: Once | INTRAVENOUS | Status: AC | PRN
  Administered 2013-05-14: 11 via INTRAVENOUS

## 2013-05-14 MED ORDER — REGADENOSON 0.4 MG/5ML IV SOLN
0.4000 mg | Freq: Once | INTRAVENOUS | Status: AC
  Administered 2013-05-14: 0.4 mg via INTRAVENOUS

## 2013-05-14 MED ORDER — TECHNETIUM TC 99M SESTAMIBI GENERIC - CARDIOLITE
33.0000 | Freq: Once | INTRAVENOUS | Status: AC | PRN
  Administered 2013-05-14: 33 via INTRAVENOUS

## 2013-05-14 NOTE — Progress Notes (Addendum)
[facility] SITE 3 NUCLEAR MED [address] [place], [place] [address] [phone]    Cardiology Nuclear Med Study  Susan Frye is a 67 y.o. female     MRN : [id number]     DOB: 1945-08-02  Procedure Date: 05/14/2013  Nuclear Med Background Indication for Stress Test:  Evaluation for Ischemia History:  ~6 yrs ago [id number] per pt (no records) Cardiac Risk Factors: Family History - CAD, History of Smoking and Hypertension  Symptoms:  DOE   Nuclear Pre-Procedure Caffeine/Decaff Intake:  None NPO After: 7:00pm   Lungs:  Minimal expiratory wheezing.  Albuterol inhaler used prior to Lexiscan O2 Sat: 94% on room air IV 0.9% NS with Angio Cath:  22g  IV Site: R Hand  IV Started by:  Bonnita Levan, RN  Chest Size (in):  44 Cup Size: B  Height: 6' (1.829 m)  Weight:  226 lb (102.513 kg)  BMI:  Body mass index is 30.64 kg/(m^2). Tech Comments:  N/A    Nuclear Med Study 1 or 2 day study: 1 day  Stress Test Type:  Lexiscan  Reading MD: Willa Rough, MD  Order Authorizing Provider:  Cyril Mourning, MD  Resting Radionuclide: Technetium 73m Sestamibi  Resting Radionuclide Dose: 11.0 mCi   Stress Radionuclide:  Technetium 62m Sestamibi  Stress Radionuclide Dose: 33.0 mCi           Stress Protocol Rest HR: 76 Stress HR: 81  Rest BP: 151/72 Stress BP: 144/84  Exercise Time (min): n/a METS: n/a   Predicted Max HR: 152 bpm % Max HR: 53.29 bpm Rate Pressure Product: 46962   Dose of Adenosine (mg):  n/a Dose of Lexiscan: 0.4 mg  Dose of Atropine (mg): n/a Dose of Dobutamine: n/a mcg/kg/min (at max HR)  Stress Test Technologist: Nelson Chimes, BS-ES  Nuclear Technologist:  Domenic Polite, CNMT     Rest Procedure:  Myocardial perfusion imaging was performed at rest 45 minutes following the intravenous administration of Technetium 7m Sestamibi. Rest ECG: Normal sinus rhythm. Decreased R wave in V1 and V2  Stress Procedure:  The patient received IV Lexiscan 0.4 mg over  15-seconds.  Technetium 46m Sestamibi injected at 30-seconds.  Quantitative spect images were obtained after a 45 minute delay. No symptoms with Lexiscan.  Stress ECG: No significant change from baseline ECG  QPS Raw Data Images:  Normal; no motion artifact; normal heart/lung ratio. Stress Images:  There is a small area of mild decreased uptake in the inferoseptal wall.  This area is fixed Rest Images:  Small area of mild decreased uptake in the inferoseptal wall. Subtraction (SDS):  No evidence of ischemia. Transient Ischemic Dilatation (Normal <1.22):  1.01 Lung/Heart Ratio (Normal <0.45):  0.25  Quantitative Gated Spect Images QGS EDV:  95 ml QGS ESV:  34 ml  Impression Exercise Capacity:  Lexiscan with no exercise. BP Response:  Normal blood pressure response. Clinical Symptoms:  No significant symptoms noted. ECG Impression:  No significant ST segment change suggestive of ischemia. Comparison with Prior Nuclear Study: No images to compare  Overall Impression:  The images revealed decreased uptake in the inferoseptal wall. The quantitative analysis shows only a slight area of decreased uptake. Wall motion is normal. There is no reversibility. This finding represents either some inferoseptal scar or diaphragmatic attenuation. Normal wall motion argues against scar. There is no significant ischemia. This is a low risk scan.  LV Ejection Fraction: 64%.  LV Wall Motion:  Normal Wall Motion.  Jerral Bonito, MD

## 2013-05-15 NOTE — Addendum Note (Signed)
Addended by: Milinda Antis on: 05/15/2013 09:53 AM   Modules accepted: Orders

## 2013-05-15 NOTE — Addendum Note (Signed)
Addended by: Milinda Antis on: 05/15/2013 09:51 AM   Modules accepted: Orders

## 2013-06-16 ENCOUNTER — Ambulatory Visit (INDEPENDENT_AMBULATORY_CARE_PROVIDER_SITE_OTHER): Payer: Medicare Other | Admitting: Pulmonary Disease

## 2013-06-16 ENCOUNTER — Encounter: Payer: Self-pay | Admitting: Pulmonary Disease

## 2013-06-16 VITALS — BP 124/64 | HR 77 | Ht 72.0 in | Wt 230.6 lb

## 2013-06-16 DIAGNOSIS — J449 Chronic obstructive pulmonary disease, unspecified: Secondary | ICD-10-CM

## 2013-06-16 NOTE — Progress Notes (Signed)
  Subjective:    Patient ID: Susan Frye, female    DOB: 01-Mar-1946, 67 y.o.   MRN: [id number]  HPI  67 yowf with COPD ,quit smokng 03/2004 presents for followup of dyspnea.  PFT's 12/10/03 FEV1 1.12 (36%) ratio 49  - PFT's 05/31/2011 1.31 (46%) ratio 50 and 13% p B2 with DLC0 40%  Her husband passed away in 2014-02-12after a stroke and she reports mild depression since then.. She reports increased dyspnea on physical activity. She denies wheezing or frequent chest colds. He denies chest pain palpitations, orthopnea or paroxysmal nocturnal dyspnea. She has lost significant weight from 256-228 pounds in the last 2 years intentionally.  She reports chronic back pain due to spinal stenosis and surgery has been recommended by Dr. Lovell Sheehan.  Chest x-ray in October 2012 showed changes of emphysema, calcified granuloma in the right middle lobe and stable scarring in the left midlung.  Spirometry today shows unchanged FEV1 at 1.28-44% and FVC of 2.09-53% with ratio 61.  She desaturated to 87% on walking second lap and recovered to 91% with an a couple of minutes. She was limited due to her legs giving out   06/16/2013  Chief Complaint  Patient presents with  . Follow-up    Pt feels breathing is doing bettter. Denies any wheezing and no chest tx. Occasional cough.    Low risk myocardial perfusion scan Feels better, was dyspneic durig sep/oct but much improved now Has been contacted by pulm rehab    Review of Systems neg for any significant sore throat, dysphagia, itching, sneezing, nasal congestion or excess/ purulent secretions, fever, chills, sweats, unintended wt loss, pleuritic or exertional cp, hempoptysis, orthopnea pnd or change in chronic leg swelling. Also denies presyncope, palpitations, heartburn, abdominal pain, nausea, vomiting, diarrhea or change in bowel or urinary habits, dysuria,hematuria, rash, arthralgias, visual complaints, headache, numbness weakness or ataxia.      Objective:   Physical Exam  Gen. Pleasant, obese, in no distress ENT - no lesions, no post nasal drip Neck: No JVD, no thyromegaly, no carotid bruits Lungs: no use of accessory muscles, no dullness to percussion, decreased without rales or rhonchi  Cardiovascular: Rhythm regular, heart sounds  normal, no murmurs or gallops, no peripheral edema Musculoskeletal: No deformities, no cyanosis or clubbing , no tremors       Assessment & Plan:

## 2013-06-16 NOTE — Patient Instructions (Signed)
Good luck with pulmonary rehab Stay on spiriva  Take rescue inhaler as needed

## 2013-06-16 NOTE — Assessment & Plan Note (Signed)
Get started with pulmonary rehab Stay on spiriva  Take rescue inhaler as needed Consider adding symbicort or Breo if symptoms worse

## 2013-06-17 ENCOUNTER — Telehealth (HOSPITAL_COMMUNITY): Payer: Self-pay | Admitting: *Deleted

## 2013-09-08 ENCOUNTER — Telehealth (HOSPITAL_COMMUNITY): Payer: Self-pay

## 2013-09-08 NOTE — Telephone Encounter (Signed)
Called patient to inquire about pulmonary rehab.  Patient is interested and will check with her insurance regarding coverage.

## 2013-09-22 ENCOUNTER — Other Ambulatory Visit (HOSPITAL_COMMUNITY): Payer: Self-pay | Admitting: Family Medicine

## 2013-09-22 DIAGNOSIS — Z1231 Encounter for screening mammogram for malignant neoplasm of breast: Secondary | ICD-10-CM

## 2013-09-29 ENCOUNTER — Ambulatory Visit (HOSPITAL_COMMUNITY)
Admission: RE | Admit: 2013-09-29 | Discharge: 2013-09-29 | Disposition: A | Discharge status: Home / Self Care | Payer: Medicare Other | Source: Ambulatory Visit | Attending: Family Medicine | Admitting: Family Medicine

## 2013-09-29 DIAGNOSIS — Z1231 Encounter for screening mammogram for malignant neoplasm of breast: Secondary | ICD-10-CM | POA: Insufficient documentation

## 2013-10-20 ENCOUNTER — Telehealth: Payer: Self-pay | Admitting: Pulmonary Disease

## 2013-10-20 NOTE — Telephone Encounter (Signed)
lmomtcb x1 for Molly 

## 2013-10-21 NOTE — Telephone Encounter (Signed)
Rehab is needing an updated pulm rehab referral. They have an old one that is outdated. I have given an updated form to Mindy to have Dr. Elsworth Soho complete once he is back in the office. Homeworth Bing, CMA

## 2013-11-10 ENCOUNTER — Encounter (HOSPITAL_COMMUNITY)
Admission: RE | Admit: 2013-11-10 | Discharge: 2013-11-10 | Disposition: A | Discharge status: Home / Self Care | Payer: Medicare Other | Source: Ambulatory Visit | Attending: Pulmonary Disease | Admitting: Pulmonary Disease

## 2013-11-10 VITALS — BP 102/60 | HR 78

## 2013-11-10 DIAGNOSIS — J449 Chronic obstructive pulmonary disease, unspecified: Secondary | ICD-10-CM

## 2013-11-10 DIAGNOSIS — J4489 Other specified chronic obstructive pulmonary disease: Secondary | ICD-10-CM | POA: Insufficient documentation

## 2013-11-10 DIAGNOSIS — Z5189 Encounter for other specified aftercare: Secondary | ICD-10-CM | POA: Insufficient documentation

## 2013-11-10 NOTE — Progress Notes (Signed)
Susan Frye 68 y.o. female Pulmonary Rehab Orientation Note Patient arrived today in Cardiac and Pulmonary Rehab for orientation to Pulmonary Rehab. She was transported from [employer] via wheel chair. She does not carry portable oxygen. Per pt, she uses oxygen never.  Her oxygen saturations was 87% after arriving to our department and after purse-lip breathing it increased to 93%.  Color good, skin warm and dry. Patient is oriented to time and place. Patient's medical history and medications reviewed. Heart rate is normal, breath sounds clear to auscultation, no wheezes, rales, or rhonchi. Grip strength equal, strong. Distal pulses 2+ pedal pulses present without edema.  Patient reports she does take medications as prescribed. Patient states she follows a Regular. The patient has been trying to lose weight through a healthy diet and exercise program.  Patient's weight will be monitored closely. Demonstration and practice of PLB using pulse oximeter. Patient able to return demonstration satisfactorily. Safety and hand hygiene in the exercise area reviewed with patient. Patient voices understanding of the information reviewed. Department expectations discussed with patient and achievable goals were set. The patient shows enthusiasm about attending the program and we look forward to working with this nice gentleman. The patient is scheduled for a 6 min walk test on Thursday, November 13, 2013 @ 3:30pm and to begin exercise on Tuesday, November 18, 2013 in the 1:30pm class.  Nyssa RN

## 2013-11-10 NOTE — Psychosocial Assessment (Signed)
Susan Frye 68 y.o. female  Initial Psychosocial Assessment  Pt psychosocial assessment reveals pt lives alone. 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 Pt hobbies include reading autobiographies and biographies, and scrapbooking.  She is presently working on a scrapbook for her grandson who graduates from high school this year.  Pt reports her stress level is low. Areas of stress/anxiety include Family.   She has a sister who has been in hospice care for 2 years, and continues to have poor health, but continues to live, and she lives in [place].   Pt does exhibit  signs of depression.  Signs of depression include guilt about her years of smoking and current COPD diagnosis and difficulty falling asleep.   She was on Paxil for several years, but she developed hand tremors and was taken off it.  She feels she needs to be on another anti-depressant and will discuss with Dr. Elsworth Soho on her next appointment which is in 2 weeks.  Pt shows good  coping skills with positive outlook.  Offered emotional support and reassurance. Monitor and evaluate progress toward psychosocial goal(s).  Goal(s): Improved management of depression                                                                                                                      Improved coping skills Help patient work toward returning to meaningful activities that improve patient's QOL and are attainable with patient's lung disease Improve patient's breathing Patient would like to be able to walk for exercise with her friend/neighbor  11/10/2013 2:31 PM

## 2013-11-13 ENCOUNTER — Encounter (HOSPITAL_COMMUNITY)
Admission: RE | Admit: 2013-11-13 | Discharge: 2013-11-13 | Disposition: A | Discharge status: Home / Self Care | Payer: Medicare Other | Source: Ambulatory Visit | Attending: Pulmonary Disease | Admitting: Pulmonary Disease

## 2013-11-13 ENCOUNTER — Encounter (HOSPITAL_COMMUNITY): Payer: Self-pay

## 2013-11-13 NOTE — Progress Notes (Signed)
Susan Frye completed a Six-Minute Walk Test on 11/13/13 . Susan Frye walked 942 feet with 0 breaks.  The patient's lowest oxygen saturation was 87% , highest heart rate was 125bpm , and highest blood pressure was 188/84. The patient started off on room air.  When the patient stated to desaturate, I increased her to 2 and then 3 liters. Susan Frye stated that her shoulder tightness hindered herwalk test.

## 2013-11-14 ENCOUNTER — Encounter (HOSPITAL_COMMUNITY): Payer: Self-pay

## 2013-11-14 NOTE — Outcomes Assessment (Signed)
[facility] [place] Pulmonary Rehabilitation Baseline Outcomes Assessment   Anthropometrics:    Height (inches): 70   Weight (kg): 106.0   Grip strength was measured using a Dynamometer.  The patient's highest score was a 25.  Functional Status/Exercise Capacity:   Ki had a resting heart rate of 78 BPM, a resting blood pressure of 102/60, and an oxygen saturation of 93 % on 0 liters of O2.  Kiira performed a 6-minute walk test on 11/13/13.  The patient completed 942 feet in 6 minutes with 0 rest breaks.  This quantifies 2.3 METS.   Dyspnea Measures:   The [facility] is a simple and standardized method of classifying disability in patients with COPD.  The assessment correlates disability and dyspnea.  At entrance the patient scored a 2. The scale is provided below.   0= I only get breathless with strenuous exercise. 1= I get short of breath when hurrying on level ground or walking up a slight incline. 2= On level ground, I walk slower than people of the same age because of breathlessness, or have to stop for breath when walking at my own pace. 3= I stop for breath after walking 100 yards or after a few minutes on level ground. 4=I am too breathless to leave the house or I am breathless when dressing.     The patient completed the [place] ([facility] [place]).  This questionnaire relates activities of daily living and shortness of breath.  The score ranges from 0-120, a higher score relates to severe shortness of breath during activities of daily living. The patient's score at entrance was 32.  Quality of Life:   Ferrans and Powers Quality of Life Index Pulmonary Version is used to assess the patients satisfaction in different domains of their life; health and functioning, socioeconomic, psychological/spiritual, and family. The overall score is recorded out of 30 points.  The patient's goal is to achieve an overall score of 21 or  higher.  Jamar received a 16.62 at entrance.    The Patient Health Questionnaire (PHQ-2) is a first step approach for the screening of depression.  If the patient scores positive on the PHQ-2 the patient should be further assessed with the PHQ-9.  The Patient Health Questionnaire (PHQ-9) assesses the degree of depression.  Depression is important to monitor and track in pulmonary patients due to its prevalence in the population.  If the patient advances to the PHQ-9 the goal is to score less than 4 on this assessment.  Aditri scored a 3 on the PHQ-2 and a 10 on the PHQ-9 at entrance.  Clinical Assessment Tools:   The COPD Assessment Test (CAT) is a measurement tool to quantify how much of an impact the disease has on the patient's life.  This assessment aids the Pulmonary Rehab Team in designing the patients individualized treatment plan.  A CAT score ranges from 0-40.  A score of 10 or below indicates that COPD has a low impact on the patient's life whereas a score of 30 or higher indicates a severe impact. The patient's goal is a decrease of 1 point from entrance to discharge.  Denai had a CAT score of 16 at entrance.  Nutrition:   The "Rate My Plate" is a dietary assessment that quantifies the balance of a patient's diet.  This tool allows the Pulmonary Rehab Team to key in on the areas of the patient's diet that needs improving.  The team  can then focus their nutritional education on those areas.  If the patient scores 24-40, this means there are many ways they can make their eating habits healthier, 41-57 states that there are some ways they can make their eating habits healthier and a score of 58-72 states that they are making many healthy choices.  The patient's goal is to achieve a score of 49 or higher on this assessment.  Allora scored a 27 at entrance.  Oxygen Compliance:   Patient is currently on 0 liters at rest, 0 liters at night, and 0 liters for exercise.  Dakari is not currently using a cpap at  night.    Education:   Cyd will attend education classes during the course of Pulmonary Rehab.  Education classes that will be offered to the patient are Activities of Daily Living and Energy Conservation, Pursed Lip Breathing and Diaphragmatic Breathing, Nutrition, Exercise for the Pulmonary Patient, Warning Signs of Infection, Chronic Lung Disease, Advanced Directives, Medications, and Stress and Meditation.  The patient completed an assessment at the entrance of the program and will complete it again upon discharge to demonstrate the level of understanding provided by the educational classes.  This assessment includes 14 questions regarding all of the education topics above.  Jennica achieved a score of 8/14 at entrance.  Smoking Cessation:  The patient is not currently smoking.  Exercise:   Susann will be provided with an individualized Home Exercise Prescription (HEP) at the entrance of the program.  The patient will be followed by the Pulmonary Exercise Physiologist throughout the program to assist with the progression of the frequency, intensity, time, and type of exercise. The patient's long-term goal is to be exercising 30-60 minutes, 3-5 days per week. At entrance, the patient was exercising 0 days at home.

## 2013-11-16 NOTE — Psychosocial Assessment (Signed)
Staff MD, Rehab Director Note/Attestation  Reviewed pshyosocial assessment. Agree with goal  Laid out by the staff of rehab  Dr. Saphyre Cillo, M.D., F.C.C.P Pulmonary and Critical Care Medicine Staff Physician and Director of Pulmonary Rehabilitation Services [facility] Pager: [phone], If no answer or between  15:00h - 7:00h: call [phone]  11/16/2013 10:36 AM   

## 2013-11-18 ENCOUNTER — Encounter (HOSPITAL_COMMUNITY)
Admission: RE | Admit: 2013-11-18 | Discharge: 2013-11-18 | Disposition: A | Discharge status: Home / Self Care | Payer: Medicare Other | Source: Ambulatory Visit | Attending: Pulmonary Disease | Admitting: Pulmonary Disease

## 2013-11-18 DIAGNOSIS — Z5189 Encounter for other specified aftercare: Secondary | ICD-10-CM | POA: Diagnosis not present

## 2013-11-18 DIAGNOSIS — J449 Chronic obstructive pulmonary disease, unspecified: Secondary | ICD-10-CM | POA: Diagnosis present

## 2013-11-18 NOTE — Progress Notes (Signed)
Today, Susan Frye exercised at [facility]. Service time was from 1330 to 1515.  The patient exercised for more than 31 minutes performing aerobic, strengthening, and stretching exercises. Oxygen saturation, heart rate, blood pressure, rate of perceived exertion, and shortness of breath were all monitored before, during, and after exercise. Susan Frye presented with no problems at today's first exercise session.   There was no workload change during today's exercise session.  Pre-exercise vitals:   Weight kg: 105.7   Liters of O2: 2   SpO2: 93   HR: 102   BP: 98/52   CBG: NA  Exercise vitals:   Highest heartrate:  131   Lowest oxygen saturation: 86 which increased to 94 with rest and purse lip breathing   Highest blood pressure: 100/60   Liters of 02: 2  Post-exercise vitals:   SpO2: 96   HR: 100   BP: 102/64   Liters of O2: 2   CBG: NA Dr. Brand Males, Medical Director Dr. Sheran Fava is immediately available during today's Pulmonary Rehab session for Susan Frye on 11/18/2013 at 1330 class time.

## 2013-11-20 ENCOUNTER — Encounter (HOSPITAL_COMMUNITY)
Admission: RE | Admit: 2013-11-20 | Discharge: 2013-11-20 | Disposition: A | Discharge status: Home / Self Care | Payer: Medicare Other | Source: Ambulatory Visit | Attending: Pulmonary Disease | Admitting: Pulmonary Disease

## 2013-11-20 DIAGNOSIS — Z5189 Encounter for other specified aftercare: Secondary | ICD-10-CM | POA: Diagnosis not present

## 2013-11-20 NOTE — Progress Notes (Signed)
Today, Danelle exercised at [facility]. Service time was from 1:30pm to 3:30pm.  The patient exercised for more than 31 minutes performing aerobic, strengthening, and stretching exercises. Oxygen saturation, heart rate, blood pressure, rate of perceived exertion, and shortness of breath were all monitored before, during, and after exercise. Annalycia presented with no problems at today's exercise session. The patient attended "Exercise for the Pulmonary Patient" education class today during rehab.  There was no workload change during today's exercise session.  Pre-exercise vitals:   Weight kg: 105.8   Liters of O2: 2   SpO2: 94   HR: 95   BP: 122/52   CBG: na  Exercise vitals:   Highest heartrate:  106   Lowest oxygen saturation: 88   Highest blood pressure: 104/60   Liters of 02: 2  Post-exercise vitals:   SpO2: 96   HR: 83   BP: 102/60   Liters of O2: 2   CBG: na  Dr. Brand Males, Medical Director Dr. Aileen Fass is immediately available during today's Pulmonary Rehab session for Ellwood Sayers on 11/20/13 at 1:30pm class time.

## 2013-11-25 ENCOUNTER — Encounter (HOSPITAL_COMMUNITY)
Admission: RE | Admit: 2013-11-25 | Discharge: 2013-11-25 | Disposition: A | Discharge status: Home / Self Care | Payer: Medicare Other | Source: Ambulatory Visit | Attending: Pulmonary Disease | Admitting: Pulmonary Disease

## 2013-11-25 DIAGNOSIS — J449 Chronic obstructive pulmonary disease, unspecified: Secondary | ICD-10-CM | POA: Insufficient documentation

## 2013-11-25 DIAGNOSIS — Z5189 Encounter for other specified aftercare: Secondary | ICD-10-CM | POA: Insufficient documentation

## 2013-11-25 DIAGNOSIS — J4489 Other specified chronic obstructive pulmonary disease: Secondary | ICD-10-CM | POA: Insufficient documentation

## 2013-11-25 NOTE — Progress Notes (Signed)
Today, [name] exercised at [facility]. Service time was from 1330 to 1515.  The patient exercised for more than 31 minutes performing aerobic, strengthening, and stretching exercises. Oxygen saturation, heart rate, blood pressure, rate of perceived exertion, and shortness of breath were all monitored before, during, and after exercise. [name] presented with no problems at today's exercise session.   There was a workload change during today's exercise session.  Pre-exercise vitals:   Weight kg: 106.1   Liters of O2: RA   SpO2: 91   HR: 100   BP: 102/50   CBG: NA  Exercise vitals:   Highest heartrate:  106   Lowest oxygen saturation: 94   Highest blood pressure: 110/60   Liters of 02: 2  Post-exercise vitals:   SpO2: 97   HR: 88   BP: 102/64   Liters of O2: 2   CBG: NA Dr. Brand Males, Medical Director Dr. Aileen Fass is immediately available during today's Pulmonary Rehab session for Susan Frye on 11/25/2013 at 1330 class time.

## 2013-11-27 ENCOUNTER — Encounter (HOSPITAL_COMMUNITY)
Admission: RE | Admit: 2013-11-27 | Discharge: 2013-11-27 | Disposition: A | Discharge status: Home / Self Care | Payer: Medicare Other | Source: Ambulatory Visit | Attending: Pulmonary Disease | Admitting: Pulmonary Disease

## 2013-11-27 NOTE — Progress Notes (Signed)
Today, Asuna exercised at [facility]. Service time was from 1:30 to 3:15.  The patient exercised for more than 31 minutes performing aerobic, strengthening, and stretching exercises. Oxygen saturation, heart rate, blood pressure, rate of perceived exertion, and shortness of breath were all monitored before, during, and after exercise. Eliany presented with no problems at today's exercise session. The patient attended the education class "MD Lecture Day" with Dr. Chase Caller.  There was an increase in  workload change during today's exercise session.  Pre-exercise vitals:   Weight kg: 106.3   Liters of O2: 2   SpO2: 92   HR: 78   BP: 120/50   CBG: na  Exercise vitals:   Highest heartrate:  118   Lowest oxygen saturation: 84% with pursed lip breathing reinforcement oxygen saturation rose to 90%   Highest blood pressure: 126/74   Liters of 02: 2  Post-exercise vitals:   SpO2: 95   HR: 87   BP: 108/60   Liters of O2: 2   CBG: na  Dr. Brand Males, Medical Director Dr. Coralyn Pear is immediately available during today's Pulmonary Rehab session for Ellwood Sayers on 11/27/13 at 1:30pm class time.

## 2013-12-02 ENCOUNTER — Encounter (HOSPITAL_COMMUNITY): Payer: Medicare Other

## 2013-12-03 ENCOUNTER — Encounter: Payer: Self-pay | Admitting: Pulmonary Disease

## 2013-12-03 ENCOUNTER — Ambulatory Visit (INDEPENDENT_AMBULATORY_CARE_PROVIDER_SITE_OTHER): Payer: Medicare Other | Admitting: Pulmonary Disease

## 2013-12-03 VITALS — BP 124/76 | HR 98 | Temp 98.2°F | Ht 72.0 in | Wt 233.6 lb

## 2013-12-03 DIAGNOSIS — J449 Chronic obstructive pulmonary disease, unspecified: Secondary | ICD-10-CM

## 2013-12-03 NOTE — Assessment & Plan Note (Signed)
ALTERNATIVES to spiriva are BREO once daily or COMBIVENT 2 puffs every 6h  We will refer you to our research nurse for IMPACT study [place] if Breo helps & find out cost of these meds for you Ct pulm rehab

## 2013-12-03 NOTE — Progress Notes (Signed)
   Subjective:    Patient ID: Susan Frye, female    DOB: 09/19/1945, 68 y.o.   MRN: [id number]  HPI  54 yowf with gold C COPD ,quit smoking 03/2004 presents for followup of dyspnea.   Her husband passed away in 09-25-2012 after a stroke and she reports mild depression since then. She lost significant weight from 256 pounds in the last 2 years intentionally.  She reports chronic back pain due to spinal stenosis and surgery has been recommended by Dr. Arnoldo Morale. Ambulation limited by dyspnea. Chest x-ray in October 2012 showed changes of emphysema, calcified granuloma in the right middle lobe and stable scarring in the left midlung.   PFT's 12/10/03 FEV1 1.12 (36%) ratio 49  - PFT's 05/31/2011 1.31 (46%) ratio 50 and 13% p B2 with DLC0 40%  Spirometry 04/2013 - unchanged FEV1 at 1.28-44% and FVC of 2.09-53% with ratio 61.  She desaturated to 87% on walking second lap and recovered to 91% with an a couple of minutes. She was limited due to her legs giving out  Low risk myocardial perfusion scan 2014   12/03/2013  Chief Complaint  Patient presents with  . Follow-up    Breathing is doing ok. Still going to pulm rehab. No wheezing, no chest tx   81m FU  Uses O2 during rehab, enjoys it, plans to visit sister in [place] & will miss a few sessions spiriva too expensive Sadness of mood +  Review of Systems  neg for any significant sore throat, dysphagia, itching, sneezing, nasal congestion or excess/ purulent secretions, fever, chills, sweats, unintended wt loss, pleuritic or exertional cp, hempoptysis, orthopnea pnd or change in chronic leg swelling. Also denies presyncope, palpitations, heartburn, abdominal pain, nausea, vomiting, diarrhea or change in bowel or urinary habits, dysuria,hematuria, rash, arthralgias, visual complaints, headache, numbness weakness or ataxia.     Objective:   Physical Exam  Gen. Pleasant, obese, in no distress ENT - no lesions, no post nasal drip Neck: No  JVD, no thyromegaly, no carotid bruits Lungs: no use of accessory muscles, no dullness to percussion, decreased without rales or rhonchi  Cardiovascular: Rhythm regular, heart sounds  normal, no murmurs or gallops, no peripheral edema Musculoskeletal: No deformities, no cyanosis or clubbing , no tremors       Assessment & Plan:

## 2013-12-03 NOTE — Patient Instructions (Signed)
ALTERNATIVES to spiriva are BREO once daily or COMBIVENT 2 puffs every 6h  We will refer you to our research nurse for IMPACT study [place] if Breo helps & find out cost of these meds for you

## 2013-12-04 ENCOUNTER — Encounter (HOSPITAL_COMMUNITY)
Admission: RE | Admit: 2013-12-04 | Discharge: 2013-12-04 | Disposition: A | Discharge status: Home / Self Care | Payer: Medicare Other | Source: Ambulatory Visit | Attending: Pulmonary Disease | Admitting: Pulmonary Disease

## 2013-12-04 NOTE — Progress Notes (Signed)
Today, Susan Frye exercised at [facility] [facility]. Service time was from 1:30pm to 3:45pm.  The patient exercised for more than 31 minutes performing aerobic, strengthening, and stretching exercises. Oxygen saturation, heart rate, blood pressure, rate of perceived exertion, and shortness of breath were all monitored before, during, and after exercise. Susan Frye presented with no problems at today's exercise session. The patient attended "Nutrition for the Pulmonary Patient" education class with Derek Mound.  There was no workload change during today's exercise session.  Pre-exercise vitals:   Weight kg: 105.2   Liters of O2: 2   SpO2: 91   HR: 101   BP: 122/44   CBG: na  Exercise vitals:   Highest heartrate:  114   Lowest oxygen saturation: 91   Highest blood pressure: 132/66   Liters of 02: 2  Post-exercise vitals:   SpO2: 90   HR: 104   BP: 110/60   Liters of O2: ra   CBG: na  Dr. Brand Males, Medical Director Dr. Aileen Fass is immediately available during today's Pulmonary Rehab session for Susan Frye on 12/04/13 at 1:30pm class time.

## 2013-12-09 ENCOUNTER — Encounter (HOSPITAL_COMMUNITY): Payer: Medicare Other

## 2013-12-11 ENCOUNTER — Encounter (HOSPITAL_COMMUNITY): Payer: Medicare Other

## 2013-12-11 NOTE — Psychosocial Assessment (Signed)
Susan Frye 68 y.o. female  30 day Psychosocial Note  Patient psychosocial assessment reveals one barrier to participation in Pulmonary Rehab. Psychosocial areas that is currently affecting patient's rehab experience include concerns about her back pain.  Patient has spinal stenosis and has been told she needs surgery, but presently she is delaying this.  She is able to participate in the program, but can only tolerate standing for short periods of time before her back and leg pain require her to sit down.  After sitting for 1-2 minutes her pain is relieved.  She has only attended 5 exercise sessions and has been absent this week and upon calling to check on her she did answer.   Patient does continue to exhibit positive coping skills to deal with  her psychosocial concerns. Offered emotional support and reassurance. Patient does feel she is making progress toward Pulmonary Rehab goals. Patient reports her health and activity level has not improved in the past 30 days due to only attending 5 exercise sessions.. Patient states family/friends have not noticed changes in her activity or mood. Patient reports feeling positive about current and projected progression in Pulmonary Rehab. After reviewing the patient's treatment plan, the patient is making progress toward Pulmonary Rehab goals. Patient's rate of progress toward rehab goals is fair. Patient has made increases in workloads on the walking track and the nustep.  Plan of action to help patient continue to work towards rehab goals include increasing workloads as appropriate keeping her spinal stenosis manageable. Will continue to monitor and evaluate progress toward psychosocial goal(s).  Goal(s) in progress: Increase workloads to increase strength and stamina Help patient work toward returning to meaningful activities that improve patient's QOL and are attainable with patient's lung disease and spinal stenosis

## 2013-12-16 ENCOUNTER — Encounter (HOSPITAL_COMMUNITY)
Admission: RE | Admit: 2013-12-16 | Discharge: 2013-12-16 | Disposition: A | Discharge status: Home / Self Care | Payer: Medicare Other | Source: Ambulatory Visit | Attending: Pulmonary Disease | Admitting: Pulmonary Disease

## 2013-12-17 NOTE — Psychosocial Assessment (Signed)
Staff MD, Rehab Director Note/Attestation  Reviewed pshyosocial assessment. Agree with goal  Laid out by the staff of rehab  Dr. Anastasia Tompson, M.D., F.C.C.P Pulmonary and Critical Care Medicine Staff Physician and Director of Pulmonary Rehabilitation Services [facility] Pager: [phone], If no answer or between  15:00h - 7:00h: call [phone]  12/17/2013 5:32 PM   

## 2013-12-18 ENCOUNTER — Encounter (HOSPITAL_COMMUNITY): Admission: RE | Admit: 2013-12-18 | Payer: Medicare Other | Source: Ambulatory Visit

## 2013-12-23 ENCOUNTER — Encounter (HOSPITAL_COMMUNITY): Payer: Medicare Other

## 2013-12-25 ENCOUNTER — Ambulatory Visit (INDEPENDENT_AMBULATORY_CARE_PROVIDER_SITE_OTHER): Payer: Medicare Other | Admitting: Adult Health

## 2013-12-25 ENCOUNTER — Ambulatory Visit (INDEPENDENT_AMBULATORY_CARE_PROVIDER_SITE_OTHER)
Admission: RE | Admit: 2013-12-25 | Discharge: 2013-12-25 | Disposition: A | Discharge status: Home / Self Care | Payer: Medicare Other | Source: Ambulatory Visit | Attending: Adult Health | Admitting: Adult Health

## 2013-12-25 ENCOUNTER — Encounter (HOSPITAL_COMMUNITY)
Admission: RE | Admit: 2013-12-25 | Discharge: 2013-12-25 | Disposition: A | Discharge status: Home / Self Care | Payer: Medicare Other | Source: Ambulatory Visit | Attending: Pulmonary Disease | Admitting: Pulmonary Disease

## 2013-12-25 ENCOUNTER — Encounter: Payer: Self-pay | Admitting: Adult Health

## 2013-12-25 VITALS — BP 110/60 | HR 93 | Temp 97.9°F | Ht 72.0 in | Wt 229.4 lb

## 2013-12-25 DIAGNOSIS — J449 Chronic obstructive pulmonary disease, unspecified: Secondary | ICD-10-CM

## 2013-12-25 DIAGNOSIS — J4489 Other specified chronic obstructive pulmonary disease: Secondary | ICD-10-CM

## 2013-12-25 DIAGNOSIS — Z5189 Encounter for other specified aftercare: Secondary | ICD-10-CM | POA: Insufficient documentation

## 2013-12-25 MED ORDER — LEVALBUTEROL HCL 0.63 MG/3ML IN NEBU
0.6300 mg | INHALATION_SOLUTION | Freq: Once | RESPIRATORY_TRACT | Status: DC
  Administered 2013-12-25: 0.63 mg via RESPIRATORY_TRACT

## 2013-12-25 MED ORDER — PREDNISONE 10 MG PO TABS: ORAL_TABLET | ORAL | Status: DC

## 2013-12-25 NOTE — Assessment & Plan Note (Signed)
Exacerbation w/ known exertional desats  Has agreed to begin O2 at home  Check xray today  xopenex neb x 1 in office   Plan  Prednisone taper over next week  Begin Oxygen 2l/m with activity and At bedtime   Set up ONO on Room Air  Continue on BREO  1 puff daily  Chest xray today .  Follow up Dr. Elsworth Soho  In 2-3 weeks and As needed   Please contact office for sooner follow up if symptoms do not improve or worsen or seek emergency care

## 2013-12-25 NOTE — Progress Notes (Signed)
   Subjective:    Patient ID: Susan Frye, female    DOB: [birth year], 68 y.o.   MRN: [id number]  HPI   1 yowf with gold C COPD ,quit smoking 03/2004 presents for followup of dyspnea.   Her husband passed away in 2012/09/08 after a stroke and she reports mild depression since then. She lost significant weight from 256 pounds in the last 2 years intentionally.  She reports chronic back pain due to spinal stenosis and surgery has been recommended by Dr. Arnoldo Morale. Ambulation limited by dyspnea. Chest x-ray in October 2012 showed changes of emphysema, calcified granuloma in the right middle lobe and stable scarring in the left midlung.   PFT's 12/10/03 FEV1 1.12 (36%) ratio 49  - PFT's 05/31/2011 1.31 (46%) ratio 50 and 13% p B2 with DLC0 40%  Spirometry 04/2013 - unchanged FEV1 at 1.28-44% and FVC of 2.09-53% with ratio 61.  She desaturated to 87% on walking second lap and recovered to 91% with an a couple of minutes. She was limited due to her legs giving out  Low risk myocardial perfusion scan 2014    42m FU 12/03/13  Uses O2 during rehab, enjoys it, plans to visit sister in [place] & will miss a few sessions spiriva too expensive Sadness of mood +   ,12/25/2013  Complains of 5 days of congestion-minimal  , tightness, dyspnea , and wheezing . More DOE . Was unable to do Pulmonary rehab today . No chest pain,orthopnea, edema , calf pain , hemoptysis , fever or discolored mucus.  Sats on arrival low today 84-87% walking. Has had exertional desats for a while not on O2 at home. Uses O2 at pulmonary rehab to exercise.  Sister in [place] dying of COPD , just went to see her for last 2 weeks, returned few days ago.  Main dyspnea is with walking, none at rest.  sats 97% at rest on RA.   Review of Systems   neg for any significant sore throat, dysphagia, itching, sneezing, nasal congestion or excess/ purulent secretions, fever, chills, sweats, unintended wt loss, pleuritic or exertional cp,  hempoptysis, orthopnea pnd or change in chronic leg swelling. Also denies presyncope, palpitations, heartburn, abdominal pain, nausea, vomiting, diarrhea or change in bowel or urinary habits, dysuria,hematuria, rash, arthralgias, visual complaints, headache, numbness weakness or ataxia.     Objective:   Physical Exam   Gen. Pleasant, obese, in no distress ENT - no lesions, no post nasal drip Neck: No JVD, no thyromegaly, no carotid bruits Lungs: no use of accessory muscles, no dullness to percussion, decreased with faint exp wheeze , no stridor, speaks in full sentences  Cardiovascular: Rhythm regular, heart sounds  normal, no murmurs or gallops, no peripheral edema, neg homans sign  Musculoskeletal: No deformities, no cyanosis or clubbing , no tremors       Assessment & Plan:

## 2013-12-25 NOTE — Addendum Note (Signed)
Addended by: Maurice March on: 12/25/2013 05:12 PM   Modules accepted: Orders

## 2013-12-25 NOTE — Patient Instructions (Signed)
Prednisone taper over next week  Begin Oxygen 2l/m with activity and At bedtime   Set up ONO on Room Air  Continue on BREO  1 puff daily  Chest xray today .  Follow up Dr. Elsworth Soho  In 2-3 weeks and As needed   Please contact office for sooner follow up if symptoms do not improve or worsen or seek emergency care

## 2013-12-25 NOTE — Progress Notes (Signed)
Today, Alvenia exercised at [facility]. Service time was from 1330 to 1530.  The patient exercised for more than 31 minutes performing aerobic, strengthening, and stretching exercises. Oxygen saturation, heart rate, blood pressure, rate of perceived exertion, and shortness of breath were all monitored before, during, and after exercise. Fifi presented with no problems at today's exercise session. Keniah also attended an education session on exercise for the pulmonary patient.  There was no workload change during today's exercise session.  Pre-exercise vitals:   Weight kg: 103.4   Liters of O2: 2L   SpO2: 94   HR: 104   BP: 108/54   CBG: na  Exercise vitals:   Highest heartrate:  117   Lowest oxygen saturation: 90   Highest blood pressure: 140/70   Liters of 02: 2L  Post-exercise vitals:   SpO2: 96   HR: 108   BP: 104/60   Liters of O2: 2L   CBG: na  Dr. Brand Males, Medical Director Dr. Dyann Kief is immediately available during today's Pulmonary Rehab session for Ellwood Sayers on 12/25/2013 at 1330 class time.

## 2013-12-26 NOTE — Progress Notes (Signed)
Quick Note:  ATC pt x1 - line rang numerous times with no answer and no option to LM. WCB. ______

## 2013-12-30 ENCOUNTER — Encounter (HOSPITAL_COMMUNITY)
Admission: RE | Admit: 2013-12-30 | Discharge: 2013-12-30 | Disposition: A | Discharge status: Home / Self Care | Payer: Medicare Other | Source: Ambulatory Visit | Attending: Pulmonary Disease | Admitting: Pulmonary Disease

## 2013-12-30 ENCOUNTER — Telehealth: Payer: Self-pay | Admitting: Pulmonary Disease

## 2013-12-30 NOTE — Progress Notes (Signed)
Today, Susan Frye exercised at [facility]. Service time was from 1330 to 1500.  The patient exercised for more than 31 minutes performing aerobic, strengthening, and stretching exercises. Oxygen saturation, heart rate, blood pressure, rate of perceived exertion, and shortness of breath were all monitored before, during, and after exercise. Susan Frye presented with no problems at today's exercise session.   There was no workload change during today's exercise session.  Pre-exercise vitals:   Weight kg: 105.9   Liters of O2: 2L   SpO2: 98   HR: 99   BP: 104/50   CBG: na  Exercise vitals:   Highest heartrate:  116   Lowest oxygen saturation: 86 increased to 91 with PLB   Highest blood pressure: 132/66   Liters of 02: 2L  Post-exercise vitals:   SpO2: 95   HR: 90   BP: 102/62   Liters of O2: 2L   CBG: na  Dr. Brand Males, Medical Director Dr. Dyann Kief is immediately available during today's Pulmonary Rehab session for Susan Frye on 12/30/2013 at 1330 class time.

## 2013-12-30 NOTE — Progress Notes (Signed)
I have reviewed a Home Exercise Prescription with Susan Frye . Susan Frye is currently exercising at home.  The patient was advised to walk 2-3 days a week for 25 minutes.  Susan Frye and I discussed how to progress their exercise prescription.  The patient stated that their goals were to improve breathing and decrease weight.  The patient stated that they understand the exercise prescription.  We reviewed exercise guidelines, target heart rate during exercise, oxygen use, weather, home pulse oximeter, endpoints for exercise, and goals.  Patient is encouraged to come to me with any questions. I will continue to follow up with the patient to assist them with progression and safety.

## 2013-12-30 NOTE — Telephone Encounter (Signed)
Per 12/25/13; Patient Instructions      Prednisone taper over next week   Begin Oxygen 2l/m with activity and At bedtime    Set up ONO on Room Air   Continue on BREO  1 puff daily   Chest xray today .   Follow up Dr. Elsworth Soho  In 2-3 weeks and As needed    Please contact office for sooner follow up if symptoms do not improve or worsen or seek emergency care    --  Called spoke with pt. She is still on prednisone taper. She feels her breathing is worse. She just seems she doesn't have enough air. Least little activity she feels SOB. SHE IS SCHEDULED TO COME IN AND SEE ra TOMORROW. NOTHING FURTHER NEEDED

## 2013-12-30 NOTE — Progress Notes (Signed)
Reviewed & agree with plan  

## 2013-12-31 ENCOUNTER — Encounter: Payer: Self-pay | Admitting: Pulmonary Disease

## 2013-12-31 ENCOUNTER — Ambulatory Visit (INDEPENDENT_AMBULATORY_CARE_PROVIDER_SITE_OTHER): Payer: Medicare Other | Admitting: Pulmonary Disease

## 2013-12-31 ENCOUNTER — Other Ambulatory Visit (INDEPENDENT_AMBULATORY_CARE_PROVIDER_SITE_OTHER): Payer: Medicare Other

## 2013-12-31 VITALS — BP 130/84 | HR 84 | Temp 97.9°F | Ht 72.0 in | Wt 223.0 lb

## 2013-12-31 DIAGNOSIS — R0902 Hypoxemia: Secondary | ICD-10-CM

## 2013-12-31 DIAGNOSIS — J449 Chronic obstructive pulmonary disease, unspecified: Secondary | ICD-10-CM

## 2013-12-31 DIAGNOSIS — J9611 Chronic respiratory failure with hypoxia: Secondary | ICD-10-CM | POA: Insufficient documentation

## 2013-12-31 LAB — BASIC METABOLIC PANEL
BUN: 19 mg/dL (ref 6–23)
CHLORIDE: 97 meq/L (ref 96–112)
CO2: 34 mEq/L — ABNORMAL HIGH (ref 19–32)
Calcium: 9.5 mg/dL (ref 8.4–10.5)
Creatinine, Ser: 0.8 mg/dL (ref 0.4–1.2)
GFR: 78.9 mL/min (ref >60.00)
Glucose, Bld: 122 mg/dL — ABNORMAL HIGH (ref 70–99)
Potassium: 5 mEq/L (ref 3.5–5.1)
Sodium: 138 mEq/L (ref 135–145)

## 2013-12-31 LAB — CBC WITH DIFFERENTIAL/PLATELET
BASOS PCT: 0.3 % (ref 0.0–3.0)
Basophils Absolute: 0 10*3/uL (ref 0.0–0.1)
Eosinophils Absolute: 0.1 10*3/uL (ref 0.0–0.7)
Eosinophils Relative: 0.6 % (ref 0.0–5.0)
HCT: 42.2 % (ref 36.0–46.0)
HEMOGLOBIN: 14.1 g/dL (ref 12.0–15.0)
LYMPHS PCT: 9.4 % — AB (ref 12.0–46.0)
Lymphs Abs: 0.9 10*3/uL (ref 0.7–4.0)
MCHC: 33.3 g/dL (ref 30.0–36.0)
MCV: 90.6 fl (ref 78.0–100.0)
Monocytes Absolute: 0.5 10*3/uL (ref 0.1–1.0)
Monocytes Relative: 4.8 % (ref 3.0–12.0)
NEUTROS ABS: 8.3 10*3/uL — AB (ref 1.4–7.7)
Neutrophils Relative %: 84.9 % — ABNORMAL HIGH (ref 43.0–77.0)
Platelets: 247 10*3/uL (ref 150.0–400.0)
RBC: 4.66 Mil/uL (ref 3.87–5.11)
RDW: 14.2 % (ref 11.5–15.5)
WBC: 9.8 10*3/uL (ref 4.0–10.5)

## 2013-12-31 NOTE — Assessment & Plan Note (Addendum)
Unclear etio -have to r/o pulm embolism Blood work -CBC, BMET, D d-imer CT scan of chest for blood clot Addendum- CT angiogram was negative for PE

## 2013-12-31 NOTE — Progress Notes (Signed)
Quick Note:  Called spoke with patient, advised of cxr results / recs as stated by TP. Pt verbalized her understanding and denied any questions. ______ 

## 2013-12-31 NOTE — Progress Notes (Signed)
   Subjective:    Patient ID: Susan Frye, female    DOB: 1946/04/24, 68 y.o.   MRN: [id number]  HPI  47 yowf with gold C COPD ,quit smoking 03/2004 presents for followup of dyspnea.  Her husband passed away in 15-Sep-2012 after a stroke and she reports mild depression since then. She lost significant weight from 256 pounds in the last 2 years intentionally.  She reports chronic back pain due to spinal stenosis and surgery has been recommended by Dr. Arnoldo Morale. Ambulation limited by dyspnea.    PFT's 12/10/03 FEV1 1.12 (36%) ratio 49  - PFT's 05/31/2011 1.31 (46%) ratio 50 and 13% p B2 with DLC0 40%  Spirometry 04/2013 - unchanged FEV1 at 1.28-44% and FVC of 2.09-53% with ratio 61.  She desaturated to 87% on walking second lap and recovered to 91% with an a couple of minutes. She was limited due to her legs giving out  Low risk myocardial perfusion scan 2014     12/31/2013 Chief Complaint  Patient presents with  . Acute Visit    Increased sob over the past week.  SATS at 83% upon arrival, up to 96 on 2L o2   Sister in [place] dying of COPD , just went to see her for last 2 weeks, returned few days ago. C/o worsening dyspnea since   Acute OV on 12/25/2013 , CXR no new infx  Labs -unremarkable D-dimer negative   Review of Systems neg for any significant sore throat, dysphagia, itching, sneezing, nasal congestion or excess/ purulent secretions, fever, chills, sweats, unintended wt loss, pleuritic or exertional cp, hempoptysis, orthopnea pnd or change in chronic leg swelling. Also denies presyncope, palpitations, heartburn, abdominal pain, nausea, vomiting, diarrhea or change in bowel or urinary habits, dysuria,hematuria, rash, arthralgias, visual complaints, headache, numbness weakness or ataxia.     Objective:   Physical Exam  Gen. Pleasant, well-nourished, in no distress, normal affect ENT - no lesions, no post nasal drip Neck: No JVD, no thyromegaly, no carotid bruits Lungs: no  use of accessory muscles, no dullness to percussion, clear without rales or rhonchi  Cardiovascular: Rhythm regular, heart sounds  normal, no murmurs or gallops, no peripheral edema Abdomen: soft and non-tender, no hepatosplenomegaly, BS normal. Musculoskeletal: No deformities, no cyanosis or clubbing Neuro:  alert, non focal       Assessment & Plan:

## 2013-12-31 NOTE — Assessment & Plan Note (Signed)
Ct breo , spiriva If hypoxia persists & no acute cause found, will provide portable O2

## 2013-12-31 NOTE — Patient Instructions (Signed)
Blood work today CT scan of chest for blood clot

## 2014-01-01 ENCOUNTER — Encounter (HOSPITAL_COMMUNITY): Payer: Medicare Other

## 2014-01-01 ENCOUNTER — Ambulatory Visit (INDEPENDENT_AMBULATORY_CARE_PROVIDER_SITE_OTHER)
Admission: RE | Admit: 2014-01-01 | Discharge: 2014-01-01 | Disposition: A | Discharge status: Home / Self Care | Payer: Medicare Other | Source: Ambulatory Visit | Attending: Pulmonary Disease | Admitting: Pulmonary Disease

## 2014-01-01 DIAGNOSIS — R0902 Hypoxemia: Secondary | ICD-10-CM

## 2014-01-01 LAB — D-DIMER, QUANTITATIVE: D-Dimer, Quant: 0.27 ug/mL-FEU (ref 0.00–0.48)

## 2014-01-01 MED ORDER — IOHEXOL 300 MG/ML  SOLN
80.0000 mL | Freq: Once | INTRAMUSCULAR | Status: AC | PRN
Start: 2014-01-01 — End: 2014-01-01
  Administered 2014-01-01: 80 mL via INTRAVENOUS

## 2014-01-06 ENCOUNTER — Encounter (HOSPITAL_COMMUNITY)
Admission: RE | Admit: 2014-01-06 | Discharge: 2014-01-06 | Disposition: A | Discharge status: Home / Self Care | Payer: Medicare Other | Source: Ambulatory Visit | Attending: Pulmonary Disease | Admitting: Pulmonary Disease

## 2014-01-06 NOTE — Progress Notes (Signed)
Today, Darlisha exercised at [employer] [facility]. Service time was from 1330 to 1515.  The patient exercised for more than 31 minutes performing aerobic, strengthening, and stretching exercises. Oxygen saturation, heart rate, blood pressure, rate of perceived exertion, and shortness of breath were all monitored before, during, and after exercise. Amaris presented with no problems at today's exercise session.   There was no workload change during today's exercise session.  Pre-exercise vitals:   Weight kg: 104.9   Liters of O2: 2   SpO2: 92   HR: 62   BP: 110/50   CBG: NA  Exercise vitals:   Highest heartrate:  89   Lowest oxygen saturation: 90   Highest blood pressure: 106/54   Liters of 02: 2  Post-exercise vitals:   SpO2: 98   HR: 69   BP: 104/60   Liters of O2: 2   CBG: NA Dr. Brand Males, Medical Director Dr. Aileen Fass is immediately available during today's Pulmonary Rehab session for Ellwood Sayers on 01/06/2014 at 1330 class time.

## 2014-01-06 NOTE — Progress Notes (Signed)
Susan Frye [name] 68 y.o. female Nutrition Note Spoke with pt. Pt is obese. Pt reports her highest wt was 293 lb. Pt has been losing wt for the past 4.5 years. Pt feels she has hit a wt loss plateau over the past month. Pt goal wt is 200 lb. Making healthy food choices the majority of the time. Discussed pt incorporating more fruit/vegetables to help promote wt loss. Pt agreed to try to incorporate fish into her diet at leas once weekly. Pt's Rate Your Plate results reviewed with pt.  Pt does not avoid salty food; uses canned/ convenience food.  Pt does not add salt to food.  The role of sodium in lung disease reviewed with pt. Pt expressed understanding of the information reviewed.  Vitals - 1 value per visit 12/31/2013 12/25/2013 12/03/2013 11/10/2013  Weight (lb) 223 229.4 233.6    Vitals - 1 value per visit 06/16/2013 05/14/2013 04/24/2013 05/31/2011  Weight (lb) 230.6 226 228 256   Vitals - 1 value per visit 05/02/2011 07/13/2009  Weight (lb) 261 295   Nutrition Diagnosis   Food-and nutrition-related knowledge deficit related to lack of exposure to information as related to diagnosis of pulmonary disease   Obesity related to excessive energy intake as evidenced by a BMI of 33.4  Nutrition Rx/Est. Daily Nutrition Needs for: ? wt loss  1800-2100 Kcal  90-105 gm protein   1500 mg or less sodium       Nutrition Intervention   Pt's individual nutrition plan and goals reviewed with pt.   Benefits of adopting healthy eating habits discussed when pt's Rate Your Plate reviewed.   Pt to attend the Nutrition and Lung Disease class   Continual client-centered nutrition education by RD, as part of interdisciplinary care. Goal(s) 1. Pt to identify and limit food sources of sodium. 2. Identify food quantities necessary to achieve wt loss of  -2# per week to a goal wt of 95.1-103.3 kg (209-227 lb) at graduation from pulmonary rehab. 3. Increase fruit and vegetable intake by at least 1 serving each  daily 4. Add fish to diet at least once weekly. Monitor and Evaluate progress toward nutrition goal with team.   Derek Mound, M.Ed, RD, LDN, CDE 01/06/2014 3:00 PM

## 2014-01-08 ENCOUNTER — Encounter: Payer: Self-pay | Admitting: Adult Health

## 2014-01-08 ENCOUNTER — Telehealth: Payer: Self-pay | Admitting: Pulmonary Disease

## 2014-01-08 ENCOUNTER — Encounter (HOSPITAL_COMMUNITY)
Admission: RE | Admit: 2014-01-08 | Discharge: 2014-01-08 | Disposition: A | Discharge status: Home / Self Care | Payer: Medicare Other | Source: Ambulatory Visit | Attending: Pulmonary Disease | Admitting: Pulmonary Disease

## 2014-01-08 ENCOUNTER — Telehealth: Payer: Self-pay | Admitting: Adult Health

## 2014-01-08 DIAGNOSIS — J449 Chronic obstructive pulmonary disease, unspecified: Secondary | ICD-10-CM

## 2014-01-08 NOTE — Telephone Encounter (Signed)
Order has been placed. Staff message sent to Susan Frye.  LMTCB x1 to make pt aware

## 2014-01-08 NOTE — Telephone Encounter (Signed)
Ok to ask DME to assess

## 2014-01-08 NOTE — Telephone Encounter (Signed)
6.8.15 ONO >> ++ ONO, continue on O2 at bedtime 2lpm  Called spoke with patient, advised of ONO results/recs as stated by TP above.  Pt okay with this and verbalized her understanding.  Will sign off.

## 2014-01-08 NOTE — Progress Notes (Signed)
Today, Elliyah exercised at [facility]. Service time was from 1330 to 1515.  The patient exercised for more than 31 minutes performing aerobic, strengthening, and stretching exercises. Oxygen saturation, heart rate, blood pressure, rate of perceived exertion, and shortness of breath were all monitored before, during, and after exercise. Siarah presented with no problems at today's exercise session. Suheily also attended an education session on pulmonary medications.  There was no workload change during today's exercise session.  Pre-exercise vitals:   Weight kg: 104.4   Liters of O2: 2L   SpO2: 96   HR: 91   BP: 108/56   CBG: na  Exercise vitals:   Highest heartrate:  104   Lowest oxygen saturation: 93   Highest blood pressure: 110/60   Liters of 02: 2L  Post-exercise vitals:   SpO2: 93   HR: 94   BP: 106/50   Liters of O2: 2L   CBG: na  Dr. Brand Males, Medical Director Dr. Dyann Kief is immediately available during today's Pulmonary Rehab session for Ellwood Sayers on 01/08/2014 at 1330 class time.

## 2014-01-08 NOTE — Telephone Encounter (Signed)
Last ov 6.10.15 w/ RA Recent ONO on 6.8.15 positive for desats - pt is to continue her nocturnal O2 2lpm at bedtime thru [facility]  When calling pt to discuss her ONO results, pt stated that she goes to her daughter's home "a lot" in [place] and is requesting a portable concentrator for this purpose.  She uses [facility].  Please advise Dr Elsworth Soho, thank you.

## 2014-01-09 ENCOUNTER — Encounter (HOSPITAL_COMMUNITY): Payer: Self-pay | Admitting: *Deleted

## 2014-01-13 ENCOUNTER — Encounter (HOSPITAL_COMMUNITY)
Admission: RE | Admit: 2014-01-13 | Discharge: 2014-01-13 | Disposition: A | Discharge status: Home / Self Care | Payer: Medicare Other | Source: Ambulatory Visit | Attending: Pulmonary Disease | Admitting: Pulmonary Disease

## 2014-01-13 NOTE — Progress Notes (Signed)
Today, Susan Frye exercised at [facility]. Service time was from 1330 to 1500.  The patient exercised for more than 31 minutes performing aerobic, strengthening, and stretching exercises. Oxygen saturation, heart rate, blood pressure, rate of perceived exertion, and shortness of breath were all monitored before, during, and after exercise. Analy presented with no problems at today's exercise session.   There was no workload change during today's exercise session.  Pre-exercise vitals:   Weight kg: 103.3   Liters of O2: 2L   SpO2: 94   HR: 106   BP: 98/62   CBG: na  Exercise vitals:   Highest heartrate:  125 down to 106 with rest break   Lowest oxygen saturation: 88 up to 95 with PLB   Highest blood pressure: 98/52   Liters of 02: 2L  Post-exercise vitals:   SpO2: 93   HR: 101   BP: 100/60   Liters of O2: 2L   CBG: na  Dr. Brand Males, Medical Director Dr. Dyann Kief is immediately available during today's Pulmonary Rehab session for Susan Frye on 01/13/2014 at 1330 class time.

## 2014-01-15 ENCOUNTER — Encounter (HOSPITAL_COMMUNITY)
Admission: RE | Admit: 2014-01-15 | Discharge: 2014-01-15 | Disposition: A | Discharge status: Home / Self Care | Payer: Medicare Other | Source: Ambulatory Visit | Attending: Pulmonary Disease | Admitting: Pulmonary Disease

## 2014-01-15 NOTE — Progress Notes (Signed)
Today, Jezabella exercised at [facility]. Service time was from 1330 to 1515.  The patient exercised for more than 31 minutes performing aerobic, strengthening, and stretching exercises. Oxygen saturation, heart rate, blood pressure, rate of perceived exertion, and shortness of breath were all monitored before, during, and after exercise. Korri presented with no problems at today's exercise session. Lavida also attended an education session on stress reduction and and energy conservation.  There was no workload change during today's exercise session.  Pre-exercise vitals:   Weight kg: 103.9   Liters of O2: 2L   SpO2: 93   HR: 101   BP: 120/58   CBG: na  Exercise vitals:   Highest heartrate:  107   Lowest oxygen saturation: 94   Highest blood pressure: 132/70   Liters of 02: 2L  Post-exercise vitals:   SpO2: 91   HR: 94   BP: 133/60   Liters of O2: 2L   CBG: na  Dr. Brand Males, Medical Director Dr. Aileen Fass is immediately available during today's Pulmonary Rehab session for Ellwood Sayers on 01/15/2014 at 1330 class time.

## 2014-01-20 ENCOUNTER — Ambulatory Visit (INDEPENDENT_AMBULATORY_CARE_PROVIDER_SITE_OTHER): Payer: Medicare Other | Admitting: Pulmonary Disease

## 2014-01-20 ENCOUNTER — Encounter (HOSPITAL_COMMUNITY)
Admission: RE | Admit: 2014-01-20 | Discharge: 2014-01-20 | Disposition: A | Discharge status: Home / Self Care | Payer: Medicare Other | Source: Ambulatory Visit | Attending: Pulmonary Disease | Admitting: Pulmonary Disease

## 2014-01-20 ENCOUNTER — Encounter: Payer: Self-pay | Admitting: Pulmonary Disease

## 2014-01-20 VITALS — BP 112/70 | HR 92 | Temp 97.5°F | Ht 72.0 in | Wt 230.0 lb

## 2014-01-20 DIAGNOSIS — J449 Chronic obstructive pulmonary disease, unspecified: Secondary | ICD-10-CM

## 2014-01-20 NOTE — Patient Instructions (Signed)
Refills on spiriva Stay on oxygen during sleep & on walking Enjoy pulmonary rehab

## 2014-01-20 NOTE — Progress Notes (Signed)
   Subjective:    Patient ID: Susan Frye, female    DOB: 1946/06/07, 68 y.o.   MRN: [id number]  HPI  63 yowf with gold C COPD ,quit smoking 03/2004 presents for followup of dyspnea.  Her husband passed away in September 03, 2012 after a stroke and she reports mild depression since then. She lost significant weight from 256 pounds in the last 2 years intentionally.  She reports chronic back pain due to spinal stenosis.  Significant tests/ events  PFT's 12/10/03 FEV1 1.12 (36%) ratio 49  - PFT's 05/31/2011 1.31 (46%) ratio 50 and 13% p B2 with DLC0 40%  Spirometry 04/2013 - unchanged FEV1 at 1.28-44% and FVC of 2.09-53% with ratio 61.   Low risk myocardial perfusion scan 2014   12/2013 Acute OV ,D-dimer negative, CT angio neg  01/20/2014  Chief Complaint  Patient presents with  . Follow-up    Pt states she is wearing her O2 nightly and is attending pulmonary rehab and feels it is helping. Pt states her dyspnea in the morning has resolved. Denies cough and CP.    6.8.15 ONO >> ++ ONO, continue on O2 at bedtime 2lpm she goes to her daughter's home "a lot" in [place] and was provided a portable concentrator that goes up to 2L. Has started pulm rehab- feels more energetic Could not tell a difference with breo - spiriva helped No sadness of mood  Review of Systems neg for any significant sore throat, dysphagia, itching, sneezing, nasal congestion or excess/ purulent secretions, fever, chills, sweats, unintended wt loss, pleuritic or exertional cp, hempoptysis, orthopnea pnd or change in chronic leg swelling. Also denies presyncope, palpitations, heartburn, abdominal pain, nausea, vomiting, diarrhea or change in bowel or urinary habits, dysuria,hematuria, rash, arthralgias, visual complaints, headache, numbness weakness or ataxia.     Objective:   Physical Exam  Gen. Pleasant, well-nourished, in no distress ENT - no lesions, no post nasal drip Neck: No JVD, no thyromegaly, no carotid  bruits Lungs: no use of accessory muscles, no dullness to percussion, clear without rales or rhonchi  Cardiovascular: Rhythm regular, heart sounds  normal, no murmurs or gallops, no peripheral edema Musculoskeletal: No deformities, no cyanosis or clubbing        Assessment & Plan:

## 2014-01-20 NOTE — Assessment & Plan Note (Signed)
Refills on spiriva - Ok to stop breo for now - can resume in future if dyspnea worsens Stay on oxygen during sleep & on walking Enjoy pulmonary rehab

## 2014-01-20 NOTE — Progress Notes (Signed)
Today, Shaili exercised at [facility]. Service time was from 1330 to 1515.  The patient exercised for more than 31 minutes performing aerobic, strengthening, and stretching exercises. Oxygen saturation, heart rate, blood pressure, rate of perceived exertion, and shortness of breath were all monitored before, during, and after exercise. Bethzaida presented with no problems at today's exercise session.   There was a workload change during today's exercise session.  Pre-exercise vitals:   Weight kg: 104.4   Liters of O2: 2L   SpO2: 95   HR: 102   BP: 100/65   CBG: na  Exercise vitals:   Highest heartrate:  103   Lowest oxygen saturation: 93   Highest blood pressure: 140/50   Liters of 02: 2L  Post-exercise vitals:   SpO2: 97   HR: 91   BP: 100/60   Liters of O2: 2L   CBG: na  Dr. Brand Males, Medical Director Dr. Aileen Fass is immediately available during today's Pulmonary Rehab session for Ellwood Sayers on 1330 at 1515 class time.

## 2014-01-22 ENCOUNTER — Encounter (HOSPITAL_COMMUNITY)
Admission: RE | Admit: 2014-01-22 | Discharge: 2014-01-22 | Disposition: A | Discharge status: Home / Self Care | Payer: Medicare Other | Source: Ambulatory Visit | Attending: Pulmonary Disease | Admitting: Pulmonary Disease

## 2014-01-22 DIAGNOSIS — J449 Chronic obstructive pulmonary disease, unspecified: Secondary | ICD-10-CM | POA: Diagnosis present

## 2014-01-22 DIAGNOSIS — Z5189 Encounter for other specified aftercare: Secondary | ICD-10-CM | POA: Diagnosis not present

## 2014-01-22 DIAGNOSIS — J4489 Other specified chronic obstructive pulmonary disease: Secondary | ICD-10-CM | POA: Insufficient documentation

## 2014-01-22 NOTE — Telephone Encounter (Signed)
Duplicate message - see other 6.18.15 phone note

## 2014-01-22 NOTE — Progress Notes (Signed)
Today, Susan Frye exercised at [facility]. Service time was from 1330 to 1530.  The patient exercised for more than 31 minutes performing aerobic, strengthening, and stretching exercises. Oxygen saturation, heart rate, blood pressure, rate of perceived exertion, and shortness of breath were all monitored before, during, and after exercise. Susan Frye presented with no problems at today's exercise session. Susan Frye also attended an education session on oxygen use and safety.  There was no workload change during today's exercise session.  Pre-exercise vitals:   Weight kg: 104.8   Liters of O2: 2L   SpO2: 95   HR: 80   BP: 98/64   CBG: na  Exercise vitals:   Highest heartrate:  88   Lowest oxygen saturation: 92   Highest blood pressure: 118/62   Liters of 02: 2L  Post-exercise vitals:   SpO2: 98   HR: 87   BP: 108/64   Liters of O2: 2L   CBG: na Dr. Brand Males, Medical Director Dr. Dyann Kief is immediately available during today's Pulmonary Rehab session for Susan Frye on 01/22/2014 at 1330 class time.

## 2014-01-26 ENCOUNTER — Encounter (HOSPITAL_COMMUNITY): Payer: Self-pay | Admitting: *Deleted

## 2014-01-26 NOTE — Psychosocial Assessment (Signed)
Susan Frye 68 y.o. female 60 day Psychosocial Note  Patient psychosocial assessment reveals barriers to participation in Pulmonary Rehab. Psychosocial areas that are currently affecting patient's rehab experience include concerns about patient's sister who has terminal cancer and lives out of state.  Susan Frye spent 2 weeks taking care of her sister and missed 2 weeks of exercise sessions . Patient does continue to exhibit positive coping skills to deal with her psychosocial concerns.  It was very stressful while she was her sister's care-giver, but she has had time to physically and emotionally recover at this time.   Offered emotional support and reassurance.  Another barrier that patient is facing is her spinal stenosis.  She is unable to stand for long periods of time due to back and leg pain.  Most of her exercise is done in a sitting position to decrease the back pain.  She is able to walk short distances on the track with frequent rest periods.   Patient does feel she is making progress toward Pulmonary Rehab goals. Patient reports her health and activity level has improved in the past 30 days as evidenced by patient's report of increased ability to exercise at increased workloads in the pulmonary rehab program.  Patient states family/friends have not noticed changes her activity or mood. Patient reports feeling positive about current and projected progression in Pulmonary Rehab. After reviewing the patient's treatment plan, the patient is making progress toward Pulmonary Rehab goals. Patient's rate of progress toward rehab goals is fair. Plan of action to help patient continue to work towards rehab goals include continue to slowly increase workloads as appropriate.  Will continue to monitor and evaluate progress toward psychosocial goals.  Goal(s) in progress: Improved management of stress regarding terminally ill sister Improved coping skills Help patient work toward returning to meaningful  activities that improve patient's QOL and are attainable with patient's lung disease

## 2014-01-26 NOTE — Addendum Note (Signed)
Encounter addended by: Liliane Channel, RN on: 01/26/2014  9:07 AM<BR>     Documentation filed: Clinical Notes

## 2014-01-27 ENCOUNTER — Encounter (HOSPITAL_COMMUNITY)
Admission: RE | Admit: 2014-01-27 | Discharge: 2014-01-27 | Disposition: A | Discharge status: Home / Self Care | Payer: Medicare Other | Source: Ambulatory Visit | Attending: Pulmonary Disease | Admitting: Pulmonary Disease

## 2014-01-27 DIAGNOSIS — Z5189 Encounter for other specified aftercare: Secondary | ICD-10-CM | POA: Diagnosis not present

## 2014-01-27 NOTE — Psychosocial Assessment (Signed)
Staff MD, Rehab Director Note/Attestation  Reviewed pshyosocial assessment. Agree with goal  Laid out by the staff of rehab  Dr. Brand Males, M.D., [facility]C.P Pulmonary and Critical Care Medicine Staff Physician and Director of [facility] Pager: [phone], If no answer or between  15:00h - 7:00h: call [phone]  01/27/2014 6:48 PM

## 2014-01-27 NOTE — Progress Notes (Signed)
Today, Tassie exercised at [facility]. Service time was from 1330 to 1515.  The patient exercised for more than 31 minutes performing aerobic, strengthening, and stretching exercises. Oxygen saturation, heart rate, blood pressure, rate of perceived exertion, and shortness of breath were all monitored before, during, and after exercise. Charleene presented with no problems at today's exercise session.   There was a workload change during today's exercise session.  Pre-exercise vitals:   Weight kg: 105.5   Liters of O2: 2L   SpO2:  96   HR: 102   BP: 100/52   CBG: na  Exercise vitals:   Highest heartrate:  112   Lowest oxygen saturation: 92   Highest blood pressure: 100/60   Liters of 02: 2L  Post-exercise vitals:   SpO2: 95   HR: 94   BP: 94/58   Liters of O2: 2L   CBG: na  Dr. Brand Males, Medical Director Dr. Dyann Kief is immediately available during today's Pulmonary Rehab session for Ellwood Sayers on 1330 at 1515 class time.

## 2014-01-29 ENCOUNTER — Encounter (HOSPITAL_COMMUNITY)
Admission: RE | Admit: 2014-01-29 | Discharge: 2014-01-29 | Disposition: A | Discharge status: Home / Self Care | Payer: Medicare Other | Source: Ambulatory Visit | Attending: Pulmonary Disease | Admitting: Pulmonary Disease

## 2014-01-29 DIAGNOSIS — Z5189 Encounter for other specified aftercare: Secondary | ICD-10-CM | POA: Diagnosis not present

## 2014-01-29 NOTE — Progress Notes (Signed)
Today, Kyona exercised at [facility]. Service time was from 1345 to 1515.  The patient exercised for more than 31 minutes performing aerobic, strengthening, and stretching exercises. Oxygen saturation, heart rate, blood pressure, rate of perceived exertion, and shortness of breath were all monitored before, during, and after exercise. Honi presented with no problems at today's exercise session. Patient attended the M.D. Lecture today that covered pulmonary diseases.  There was no workload change during today's exercise session.  Pre-exercise vitals:   Weight kg: 104.9   Liters of O2: RA   SpO2: 88 increased to 91 with purse lip breathing   HR: 86   BP: 108/52   CBG: NA  Exercise vitals:   Highest heartrate:  96   Lowest oxygen saturation: 93   Highest blood pressure: 122/56   Liters of 02: 2  Post-exercise vitals:   SpO2: 94   HR: 83   BP: 96/60   Liters of O2: 2   CBG: NA Dr. Brand Males, Medical Director Dr. Aileen Fass is immediately available during today's Pulmonary Rehab session for Ellwood Sayers on 01/29/2014 at 1330 class time.

## 2014-02-03 ENCOUNTER — Encounter (HOSPITAL_COMMUNITY): Payer: Medicare Other

## 2014-02-03 NOTE — Psychosocial Assessment (Signed)
Susan Frye 68 y.o. female  85 day Psychosocial Note  Patient psychosocial assessment reveals barriers to participation in Pulmonary Rehab. Psychosocial areas that are currently affecting patient's rehab experience include concerns about her sister's terminal pulmonary disease. Her sister lives out of state and they speak often. Susan Frye has not been back to visit in the last 30 days. Susan Frye also worries about the progression of her own pulmonary disease. Susan Frye recently attended an education session with MD at pulmonary rehab and ask appropriate questions about her disease and was receptive to education. Susan Frye has decided to wear her oxygen more as needed. Susan Frye is also worried that the pain from her spinal stenosis will limit her pulmonary rehabilitation. Emotional support and reassurance given. Patient does continue to exhibit positive coping skills to deal with her psychosocial concerns. She expresses emotional support from her children/grandchildren and enjoys sharing stories about her family. Patient does feel she is making progress toward Pulmonary Rehab goals. Patient reports her health and activity level has improved in the past 30 days as evidenced by patient's report of increased ability to ambulate without as much SOB. She relates this to her increased exercise tolerance and to using her oxygen more. Patient states family/friends have not noticed changes in her activity or mood. Patient reports feeling positive about current and projected progression in Pulmonary Rehab. After reviewing the patient's treatment plan, the patient is making progress toward Pulmonary Rehab goals. Patient's rate of progress toward rehab goals is fair. Plan of action to help patient continue to work towards rehab goals include increasing workloads as tolerated on equiptment inorder to increase stamina and stregnth. Will continue to monitor and evaluate progress toward psychosocial goal(s).  Goal(s) in progress: Improved  management of stress related to her terminally ill sister. Improved coping skills. Help patient work toward returning to meaningful activities that improve patient's QOL and are attainable with patient's lung disease.

## 2014-02-05 ENCOUNTER — Encounter (HOSPITAL_COMMUNITY): Admission: RE | Admit: 2014-02-05 | Payer: Medicare Other | Source: Ambulatory Visit

## 2014-02-10 ENCOUNTER — Encounter (HOSPITAL_COMMUNITY)
Admission: RE | Admit: 2014-02-10 | Discharge: 2014-02-10 | Disposition: A | Discharge status: Home / Self Care | Payer: Medicare Other | Source: Ambulatory Visit | Attending: Pulmonary Disease | Admitting: Pulmonary Disease

## 2014-02-10 DIAGNOSIS — Z5189 Encounter for other specified aftercare: Secondary | ICD-10-CM | POA: Diagnosis not present

## 2014-02-10 NOTE — Progress Notes (Signed)
Today, Sukanya exercised at [facility]. Service time was from 1330 to 1515.  The patient exercised for more than 31 minutes performing aerobic, strengthening, and stretching exercises. Oxygen saturation, heart rate, blood pressure, rate of perceived exertion, and shortness of breath were all monitored before, during, and after exercise. Kayce presented with no problems at today's exercise session.   There was no workload change during today's exercise session.  Pre-exercise vitals:   Weight kg: 102.4   Liters of O2: ra   SpO2: 90   HR: 80   BP: 118/60   CBG: na  Exercise vitals:   Highest heartrate:  114   Lowest oxygen saturation: 89 up to 91 with PLB   Highest blood pressure: 132/56   Liters of 02: 2L  Post-exercise vitals:   SpO2: 93   HR: 91   BP: 98/70   Liters of O2: 2L   CBG: na  Dr. Brand Males, Medical Director Dr. Maryland Pink is immediately available during today's Pulmonary Rehab session for Ellwood Sayers on 02/10/2014 at 1330 class time.

## 2014-02-12 ENCOUNTER — Encounter (HOSPITAL_COMMUNITY)
Admission: RE | Admit: 2014-02-12 | Discharge: 2014-02-12 | Disposition: A | Discharge status: Home / Self Care | Payer: Medicare Other | Source: Ambulatory Visit | Attending: Pulmonary Disease | Admitting: Pulmonary Disease

## 2014-02-12 DIAGNOSIS — Z5189 Encounter for other specified aftercare: Secondary | ICD-10-CM | POA: Diagnosis not present

## 2014-02-12 NOTE — Progress Notes (Signed)
Today, Selin exercised at [facility]. Service time was from 1:30 to 3:30.  The patient exercised for more than 31 minutes performing aerobic, strengthening, and stretching exercises. Oxygen saturation, heart rate, blood pressure, rate of perceived exertion, and shortness of breath were all monitored before, during, and after exercise. Havannah presented with no problems at today's exercise session. The patient attended Exercise for the Pulmonary Patient today.  There was no workload change during today's exercise session.  Pre-exercise vitals:   Weight kg: 103.0   Liters of O2: ra   SpO2: 96   HR: 103   BP: 118/64   CBG: na  Exercise vitals:   Highest heartrate:  113   Lowest oxygen saturation: 91   Highest blood pressure: 146/74   Liters of 02: 2  Post-exercise vitals:   SpO2: 94   HR: 102   BP: 126/64   Liters of O2: 2   CBG: na  Dr. Brand Males, Medical Director Dr. Carles Collet is immediately available during today's Pulmonary Rehab session for Ellwood Sayers on 02/12/14 at 1:30 class time.

## 2014-02-17 ENCOUNTER — Encounter (HOSPITAL_COMMUNITY)
Admission: RE | Admit: 2014-02-17 | Discharge: 2014-02-17 | Disposition: A | Discharge status: Home / Self Care | Payer: Medicare Other | Source: Ambulatory Visit | Attending: Pulmonary Disease | Admitting: Pulmonary Disease

## 2014-02-17 DIAGNOSIS — Z5189 Encounter for other specified aftercare: Secondary | ICD-10-CM | POA: Diagnosis not present

## 2014-02-17 NOTE — Progress Notes (Signed)
Today, Susan Frye exercised at [facility]. Service time was from 1:30 to 3:15pm.  The patient exercised for more than 31 minutes performing aerobic, strengthening, and stretching exercises. Oxygen saturation, heart rate, blood pressure, rate of perceived exertion, and shortness of breath were all monitored before, during, and after exercise. [name] presented with no problems at today's exercise session.   There was an increase in workload change during today's exercise session.  Pre-exercise vitals:   Weight kg: 103.4   Liters of O2: 2   SpO2: 93   HR: 87   BP: 102/60   CBG: na  Exercise vitals:   Highest heartrate:  115   Lowest oxygen saturation: 89   Highest blood pressure: 130/78   Liters of 02: 2  Post-exercise vitals:   SpO2: 96   HR: 73   BP: 100/60   Liters of O2: 2   CBG: na  Dr. Brand Males, Medical Director Dr. Carles Collet is immediately available during today's Pulmonary Rehab session for Susan Frye on 02/17/14 at 1:30pm class time.

## 2014-02-19 ENCOUNTER — Encounter (HOSPITAL_COMMUNITY)
Admission: RE | Admit: 2014-02-19 | Discharge: 2014-02-19 | Disposition: A | Discharge status: Home / Self Care | Payer: Medicare Other | Source: Ambulatory Visit | Attending: Pulmonary Disease | Admitting: Pulmonary Disease

## 2014-02-19 DIAGNOSIS — Z5189 Encounter for other specified aftercare: Secondary | ICD-10-CM | POA: Diagnosis not present

## 2014-02-19 NOTE — Progress Notes (Signed)
Today, Wealthy exercised at [facility]. Service time was from 1330 to 1515.  The patient exercised for more than 31 minutes performing aerobic, strengthening, and stretching exercises. Oxygen saturation, heart rate, blood pressure, rate of perceived exertion, and shortness of breath were all monitored before, during, and after exercise. Adelai presented with no problems at today's exercise session. Patient attended the class lecture today which discussed warning signs and symptoms of infection, heart attack, and stroke.  There was no workload change during today's exercise session.  Pre-exercise vitals:   Weight kg: 103.7   Liters of O2: 2   SpO2: 94   HR: 85   BP: 100/60   CBG: NA  Exercise vitals:   Highest heartrate:  103   Lowest oxygen saturation: 91   Highest blood pressure: 124/62   Liters of 02: 2  Post-exercise vitals:   SpO2: 90   HR: 90   BP: 112/60   Liters of O2: RA   CBG: NA Dr. Brand Males, Medical Director Dr. Coralyn Pear is immediately available during today's Pulmonary Rehab session for Ellwood Sayers on 02/19/2014 at 1330 class time.

## 2014-02-24 ENCOUNTER — Encounter (HOSPITAL_COMMUNITY)
Admission: RE | Admit: 2014-02-24 | Discharge: 2014-02-24 | Disposition: A | Discharge status: Home / Self Care | Payer: Medicare Other | Source: Ambulatory Visit | Attending: Pulmonary Disease | Admitting: Pulmonary Disease

## 2014-02-24 DIAGNOSIS — J449 Chronic obstructive pulmonary disease, unspecified: Secondary | ICD-10-CM | POA: Insufficient documentation

## 2014-02-24 DIAGNOSIS — J4489 Other specified chronic obstructive pulmonary disease: Secondary | ICD-10-CM | POA: Insufficient documentation

## 2014-02-24 DIAGNOSIS — Z5189 Encounter for other specified aftercare: Secondary | ICD-10-CM | POA: Insufficient documentation

## 2014-02-24 NOTE — Progress Notes (Signed)
Today, Susan Frye exercised at [facility]. Service time was from 1330 to 1515.  The patient exercised for more than 31 minutes performing aerobic, strengthening, and stretching exercises. Oxygen saturation, heart rate, blood pressure, rate of perceived exertion, and shortness of breath were all monitored before, during, and after exercise. Susan Frye presented with no problems at today's exercise session.   There was no workload change during today's exercise session.  Pre-exercise vitals:   Weight kg: 104.5   Liters of O2: 2L   SpO2: 92   HR: 107   BP: 104/50   CBG: na  Exercise vitals:   Highest heartrate:  109   Lowest oxygen saturation: 96   Highest blood pressure: 130/70   Liters of 02: 2L  Post-exercise vitals:   SpO2: 93   HR: 94   BP: 130/58   Liters of O2: ra   CBG: na  Dr. Brand Males, Medical Director Dr. Coralyn Pear is immediately available during today's Pulmonary Rehab session for Susan Frye on 02/24/2014 at 1330 class time.

## 2014-02-24 NOTE — Psychosocial Assessment (Signed)
Staff MD, Rehab Director Note/Attestation  Reviewed pshyosocial assessment. Agree with goal  Laid out by the staff of rehab  Dr. Awais Cobarrubias, M.D., F.C.C.P Pulmonary and Critical Care Medicine Staff Physician and Director of Pulmonary Rehabilitation Services [facility] Pager: [phone], If no answer or between  15:00h - 7:00h: call [phone]  02/24/2014 4:05 PM   

## 2014-02-24 NOTE — Psychosocial Assessment (Signed)
120 day Psychosocial Note  Patient psychosocial assessment reveals barriers to participation in Pulmonary Rehab. Psychosocial areas that are currently affecting patient's rehab experience include concerns about her sister's terminal pulmonary disease. Her sister lives out of state and they speak often. Susan Frye stated today that her sister is now in hospice care and they have just increased her morphine. Susan Frye also stated that her sister called her 3 different times during the night. Susan Frye verbalized how this interrupted her sleep, but she would feel guilty for not taking her sisters call. Susan Frye has not been back to visit her sister in the last 30 days. Susan Frye also worries about the progression of her own pulmonary disease, but today verbalized how her friends noticed she was doing much better during a recent shopping trip. Her friends commented on her stamina and strength. Susan Frye stated that she felt better with less SOB while shopping. Susan Frye has decided to wear her oxygen more as needed. Susan Frye is also worried that the pain from her spinal stenosis will limit her pulmonary rehabilitation. Emotional support and reassurance given. Patient does continue to exhibit positive coping skills to deal with her psychosocial concerns. She expresses emotional support from her children/grandchildren and enjoys sharing stories about her family. Patient does feel she is making progress toward Pulmonary Rehab goals. Patient reports her health and activity level has improved in the past 30 days as evidenced by patient's report of increased ability to ambulate without as much SOB. She relates this to her increased exercise tolerance and to using her oxygen more. Patient states family/friends have not noticed changes in her activity or mood. Patient reports feeling more positive about current and projected progression in Pulmonary Rehab. After reviewing the patient's treatment plan, the patient is making progress toward Pulmonary Rehab goals.  Patient's rate of progress toward rehab goals is good. Plan of action to help patient continue to work towards rehab goals include increasing workloads as tolerated on equiptment inorder to increase stamina and stregnth. Will continue to monitor and evaluate progress toward psychosocial goal(s).  Goal(s) in progress:  Improved management of stress related to her terminally ill sister.   Help patient work toward returning to meaningful activities that improve patient's QOL and are attainable with patient's lung disease.

## 2014-02-25 ENCOUNTER — Encounter: Payer: Self-pay | Admitting: Adult Health

## 2014-02-26 ENCOUNTER — Encounter (HOSPITAL_COMMUNITY)
Admission: RE | Admit: 2014-02-26 | Discharge: 2014-02-26 | Disposition: A | Discharge status: Home / Self Care | Payer: Medicare Other | Source: Ambulatory Visit | Attending: Pulmonary Disease | Admitting: Pulmonary Disease

## 2014-02-26 DIAGNOSIS — Z5189 Encounter for other specified aftercare: Secondary | ICD-10-CM | POA: Diagnosis not present

## 2014-02-26 NOTE — Psychosocial Assessment (Signed)
Staff MD, Rehab Director Note/Attestation  Reviewed pshyosocial assessment. Agree with goal  Laid out by the staff of rehab  Dr. Brand Males, M.D., [facility].C.P Pulmonary and Critical Care Medicine Staff Physician and Director of [facility] [facility] Pager: [phone], If no answer or between  15:00h - 7:00h: call [phone]  02/26/2014 8:15 AM

## 2014-02-26 NOTE — Progress Notes (Signed)
Today, Sharmayne exercised at [employer]. Service time was from 1330 to 1515.  The patient exercised for more than 31 minutes performing aerobic, strengthening, and stretching exercises. Oxygen saturation, heart rate, blood pressure, rate of perceived exertion, and shortness of breath were all monitored before, during, and after exercise. Kaiyana presented with no problems at today's exercise session. Patient attended the class today on advanced directives.  There was no workload change during today's exercise session.  Pre-exercise vitals:   Weight kg: 103.6   Liters of O2: RA   SpO2: 90   HR: 91   BP: 118/60   CBG: NA  Exercise vitals:   Highest heartrate:  105   Lowest oxygen saturation: 88   Highest blood pressure: 140/72   Liters of 02: 2  Post-exercise vitals:   SpO2: 97   HR: 87   BP: 120/62   Liters of O2: 2   CBG: NA Dr. Brand Males, Medical Director Dr. Frederic Jericho is immediately available during today's Pulmonary Rehab session for Ellwood Sayers on 02/26/2014 at 1330 class time.

## 2014-03-05 ENCOUNTER — Encounter (HOSPITAL_COMMUNITY)
Admission: RE | Admit: 2014-03-05 | Discharge: 2014-03-05 | Disposition: A | Discharge status: Home / Self Care | Payer: Medicare Other | Source: Ambulatory Visit | Attending: Pulmonary Disease | Admitting: Pulmonary Disease

## 2014-03-05 DIAGNOSIS — Z5189 Encounter for other specified aftercare: Secondary | ICD-10-CM | POA: Diagnosis not present

## 2014-03-05 NOTE — Progress Notes (Signed)
Today, Susan Frye exercised at [employer]. Service time was from 1:30pm to 3:30pm.  The patient exercised for more than 31 minutes performing aerobic, strengthening, and stretching exercises. Oxygen saturation, heart rate, blood pressure, rate of perceived exertion, and shortness of breath were all monitored before, during, and after exercise. Susan Frye presented with no problems at today's exercise session. The patient attended Pulmonary Medications education class with [facility].   There was an increase in workload change during today's exercise session.  Pre-exercise vitals:   Weight kg: 103.9   Liters of O2: 2   SpO2: 93   HR: 87   BP: 108/60   CBG: na  Exercise vitals:   Highest heartrate:  107   Lowest oxygen saturation: 94   Highest blood pressure: 140/66   Liters of 02: 2  Post-exercise vitals:   SpO2: 93   HR: 88   BP: 100/70   Liters of O2: ra   CBG: na  Dr. Brand Males, Medical Director Dr. Maryland Pink is immediately available during today's Pulmonary Rehab session for Susan Frye on 03/05/14 at 1:30pm class time.

## 2014-03-10 ENCOUNTER — Encounter (HOSPITAL_COMMUNITY)
Admission: RE | Admit: 2014-03-10 | Discharge: 2014-03-10 | Disposition: A | Discharge status: Home / Self Care | Payer: Medicare Other | Source: Ambulatory Visit | Attending: Pulmonary Disease | Admitting: Pulmonary Disease

## 2014-03-10 NOTE — Progress Notes (Signed)
.  Susan Frye completed a Six-Minute Walk Test on 03/10/14 . Susan Frye walked 985 feet with 0 breaks.  The patient's lowest oxygen saturation was 89% , highest heart rate was 120 bpm, and highest blood pressure was 160/74. The patient was on 2 liters of oxygen with a nasal cannula. Susan Frye stated that leg pain rated a 5/10 hindered her walk test.

## 2014-03-10 NOTE — Progress Notes (Signed)
DISCHARGE NOTE FROM PULMONARY REHAB  Susan Frye graduated from the pulmonary rehab program today.  She was such a pleasure to work with.  She made great progress even with her limitation of spinal stenosis.  Her attendance was regular and she worked very hard at increasing her stamina and endurance.  Her goals were met.  She accomplished being able to breathe "better" and to be able to walk with her her neighbor for exercise.  She is using oxygen more than when she started the program and she feels this is allowing her to exercise more.

## 2014-03-12 ENCOUNTER — Encounter (HOSPITAL_COMMUNITY): Payer: Self-pay

## 2014-03-12 NOTE — Progress Notes (Signed)
[facility] [place] Pulmonary Rehabilitation Final/Discharge Outcome Results  Anthropometrics:   Height (inches): 70   Weight (kg): 103.9   This is a change of -2.1kg from entrance.   Grip strength was measured using a Dynamometer.  The patient's discharge score was a 25.     This is a change of 0 from entrance.  Functional Status/Exercise Capacity:   Cayden had a resting heart rate of 97 BPM, a resting blood pressure of 100/60, and an oxygen saturation of 95 % on 2 liters of O2.  Myalynn performed a discharge 6-minute walk test on 03/10/14.  The patient completed 985 feet in 6 minutes with 0 rest breaks.  This quantifies 2.38 METS. The patient's goal is to add 82 feet onto the baseline 6MWT.     The patient increased their 6-minute walk test distance by 43 feet and their MET level by .08 METs.  Dyspnea Measures:   The [employer] is a simple and standardized method of classifying disability in patients with COPD.  The assessment correlates disability and dyspnea.  Upon discharge the patients resting score was 2. The scale is provided below.    This is a change of 0 from entrance.  0= I only get breathless with strenuous exercise. 1= I get short of breath when hurrying on level ground or walking up a slight incline. 2= On level ground, I walk slower than people of the same age because of breathlessness, or have to stop for breath when walking at my own pace. 3= I stop for breath after walking 100 yards or after a few minutes on level ground. 4=I am too breathless to leave the house or I am breathless when dressing.     The patient completed the [place] ([facility] [place]).  This questionnaire relates activities of daily living and shortness of breath.  The score ranges from 0-120, a higher score relates to severe shortness of breath during activities of daily living. The patient's score at discharge was 41.   This is a change of +9 from  entrance.  Quality of Life:   Ferrans and Powers Quality of Life Index Pulmonary Version is used to assess the patients satisfaction in different domains of their life; health and functioning, socioeconomic, psychological/spiritual, and family. The overall score is recorded out of 30 points.  The patient's goal is to achieve an overall score of 21 or higher.  Cicely received a 21.48 upon discharge.    This is a change of +4.86 from entrance.   Clinical Assessment Tools:   The COPD Assessment Test (CAT) is a measurement tool to quantify how much of an impact the disease has on the patient's life.  This assessment aids the Pulmonary Rehab Team in designing the patients individualized treatment plan.  A CAT score ranges from 0-40.  A score of 10 or below indicates that COPD has a low impact on the patient's life whereas a score of 30 or higher indicates a severe impact. The patient's goal is a decrease of 1 point from entrance to discharge.  Deliliah had a CAT score of 15 upon discharge.     This is a change of -1 from entrance.  Nutrition:   The "Rate My Plate" is a dietary assessment that quantifies the balance of a patient's diet.  This tool allows the Pulmonary Rehab Team to key in on the areas of the patient's diet that needs improving.  The team can  then focus their nutritional education on those areas.  If the patient scores 24-40, this means there are many ways they can make their eating habits healthier, 41-57 states that there are some ways they can make their eating habits healthier and a score of 58-72 states that they are making many healthy choices.  The patient's goal is to achieve a score of 49 or higher on this assessment.  Blaike scored a 52 upon discharge.     This is a change of +1 from entrance.  Oxygen Compliance:   Patient is currently on room air at rest, 2 liters at night, and 2 liters for exercise.  Tonna is not currently using cpap/bipap at night.  The patient is currently compliant  with oxygen usage.  The patient states that they do not have barriers that keep them from using their oxygen.     This is a negative change from the start of the program.    Education:   Makenley attended over 75% of the education classes.  Judaea completed a discharge educational assessment and achieved a score of 11/14.    This is a change of +3 from entrance.   Smoking Cessation:   The patient is not currently smoking.  Exercise:   Mayela was provided with an individualized Home Exercise Prescription (HEP) at the entrance of the program.  The patient's goal is to be exercising 30-60 minutes, 3-5 days per week. Upon discharge the patient is exercising 2 days at home.  This is a change of +2 days from entrance.  After graduation from Pulmonary Rehab, Yesika will continue exercising at [facility].

## 2014-03-31 ENCOUNTER — Ambulatory Visit (HOSPITAL_COMMUNITY): Payer: Medicare Other

## 2014-04-02 ENCOUNTER — Ambulatory Visit (HOSPITAL_COMMUNITY): Payer: Medicare Other

## 2014-04-07 ENCOUNTER — Ambulatory Visit (HOSPITAL_COMMUNITY): Payer: Medicare Other

## 2014-04-09 ENCOUNTER — Ambulatory Visit (HOSPITAL_COMMUNITY): Payer: Medicare Other

## 2014-04-14 ENCOUNTER — Ambulatory Visit (HOSPITAL_COMMUNITY): Payer: Medicare Other

## 2014-04-16 ENCOUNTER — Ambulatory Visit (HOSPITAL_COMMUNITY): Payer: Medicare Other

## 2014-04-21 ENCOUNTER — Ambulatory Visit (HOSPITAL_COMMUNITY): Payer: Medicare Other

## 2014-04-22 ENCOUNTER — Ambulatory Visit: Payer: Medicare Other | Admitting: Adult Health

## 2014-04-23 ENCOUNTER — Ambulatory Visit (HOSPITAL_COMMUNITY): Payer: Medicare Other

## 2014-04-24 ENCOUNTER — Ambulatory Visit (INDEPENDENT_AMBULATORY_CARE_PROVIDER_SITE_OTHER): Payer: Medicare Other | Admitting: Adult Health

## 2014-04-24 ENCOUNTER — Encounter: Payer: Self-pay | Admitting: Adult Health

## 2014-04-24 VITALS — BP 124/56 | HR 88 | Temp 98.2°F | Ht 72.0 in | Wt 231.8 lb

## 2014-04-24 DIAGNOSIS — J449 Chronic obstructive pulmonary disease, unspecified: Secondary | ICD-10-CM

## 2014-04-24 DIAGNOSIS — Z23 Encounter for immunization: Secondary | ICD-10-CM

## 2014-04-24 NOTE — Progress Notes (Signed)
   Subjective:    Patient ID: Susan Frye, female    DOB: 1945-10-25, 68 y.o.   MRN: [id number]  HPI 47 yowf with gold C COPD ,quit smoking 03/2004 presents for followup of dyspnea.  Her husband passed away in 09-16-2012 after a stroke and she reports mild depression since then. She lost significant weight from 256 pounds in the last 2 years intentionally.  She reports chronic back pain due to spinal stenosis.  Significant tests/ events  PFT's 12/10/03 FEV1 1.12 (36%) ratio 49  - PFT's 05/31/2011 1.31 (46%) ratio 50 and 13% p B2 with DLC0 40%  Spirometry 04/2013 - unchanged FEV1 at 1.28-44% and FVC of 2.09-53% with ratio 61.   Low risk myocardial perfusion scan 2014   12/2013 Acute OV ,D-dimer negative, CT angio neg  6.8.15 ONO >> ++ ONO, continue on O2 at bedtime 2lpm she goes to her daughter's home "a lot" in [place] and was provided a portable concentrator that goes up to 2L. Has started pulm rehab- feels more energetic Could not tell a difference with breo - spiriva helped No sadness of mood  04/24/2014 Follow up COPD  Returns for  3 month follow up .  Reports breathing is doing well since last ov.  Finished pulm rehab and thinking about joining maintenance program. Taking Spiriva daily  Did not care for BREO .  Remains on O2 At bedtime  And with activity As needed      Review of Systems  neg for any significant sore throat, dysphagia, itching, sneezing, nasal congestion or excess/ purulent secretions, fever, chills, sweats, unintended wt loss, pleuritic or exertional cp, hempoptysis, orthopnea pnd or change in chronic leg swelling. Also denies presyncope, palpitations, heartburn, abdominal pain, nausea, vomiting, diarrhea or change in bowel or urinary habits, dysuria,hematuria, rash, arthralgias, visual complaints, headache, numbness weakness or ataxia.     Objective:   Physical Exam   Gen. Pleasant, well-nourished, in no distress ENT - no lesions, no post nasal  drip Neck: No JVD, no thyromegaly, no carotid bruits Lungs: no use of accessory muscles, no dullness to percussion, clear without rales or rhonchi  Cardiovascular: Rhythm regular, heart sounds  normal, no murmurs or gallops, no peripheral edema Musculoskeletal: No deformities, no cyanosis or clubbing        Assessment & Plan:

## 2014-04-24 NOTE — Patient Instructions (Signed)
Continue on Spiriva 1 puff daily  Flu shot today  Remain on Oxygen 2l/m with activity and At bedtime   Follow up Dr. Elsworth Soho  In 3-4 months and As needed

## 2014-04-24 NOTE — Assessment & Plan Note (Signed)
Compensated on present regimen   Plan  Continue on Spiriva 1 puff daily  Flu shot today  Remain on Oxygen 2l/m with activity and At bedtime   Follow up Dr. Elsworth Soho  In 3-4 months and As needed

## 2014-04-28 ENCOUNTER — Ambulatory Visit (HOSPITAL_COMMUNITY): Payer: Medicare Other

## 2014-04-30 ENCOUNTER — Ambulatory Visit (HOSPITAL_COMMUNITY): Payer: Medicare Other

## 2014-05-05 ENCOUNTER — Ambulatory Visit (HOSPITAL_COMMUNITY): Payer: Medicare Other

## 2014-05-07 ENCOUNTER — Ambulatory Visit (HOSPITAL_COMMUNITY): Payer: Medicare Other

## 2014-05-12 ENCOUNTER — Ambulatory Visit (HOSPITAL_COMMUNITY): Payer: Medicare Other

## 2014-05-14 ENCOUNTER — Ambulatory Visit (HOSPITAL_COMMUNITY): Payer: Medicare Other

## 2014-05-15 ENCOUNTER — Encounter: Payer: Self-pay | Admitting: Vascular Surgery

## 2014-05-15 ENCOUNTER — Other Ambulatory Visit: Payer: Self-pay | Admitting: *Deleted

## 2014-05-19 ENCOUNTER — Ambulatory Visit (HOSPITAL_COMMUNITY): Payer: Medicare Other

## 2014-05-21 ENCOUNTER — Ambulatory Visit (HOSPITAL_COMMUNITY): Payer: Medicare Other

## 2014-05-26 ENCOUNTER — Ambulatory Visit (HOSPITAL_COMMUNITY): Payer: Medicare Other

## 2014-05-28 ENCOUNTER — Ambulatory Visit (HOSPITAL_COMMUNITY): Payer: Medicare Other

## 2014-06-02 ENCOUNTER — Ambulatory Visit (HOSPITAL_COMMUNITY): Payer: Medicare Other

## 2014-06-04 ENCOUNTER — Ambulatory Visit (HOSPITAL_COMMUNITY): Payer: Medicare Other

## 2014-06-04 ENCOUNTER — Encounter: Payer: Self-pay | Admitting: Vascular Surgery

## 2014-06-05 ENCOUNTER — Encounter (INDEPENDENT_AMBULATORY_CARE_PROVIDER_SITE_OTHER): Payer: Self-pay

## 2014-06-05 ENCOUNTER — Ambulatory Visit (INDEPENDENT_AMBULATORY_CARE_PROVIDER_SITE_OTHER): Payer: Medicare Other | Admitting: Vascular Surgery

## 2014-06-05 ENCOUNTER — Encounter: Payer: Self-pay | Admitting: Vascular Surgery

## 2014-06-05 VITALS — BP 119/78 | HR 107 | Ht 72.0 in | Wt 226.2 lb

## 2014-06-05 DIAGNOSIS — I872 Venous insufficiency (chronic) (peripheral): Secondary | ICD-10-CM | POA: Insufficient documentation

## 2014-06-05 DIAGNOSIS — M79609 Pain in unspecified limb: Secondary | ICD-10-CM

## 2014-06-05 NOTE — Progress Notes (Signed)
Referred by:  Aletha Halim, PA-C [address], [place] [address]  Reason for referral: Bilateral foot numbness and discoloration  History of Present Illness  Susan Frye is a 68 y.o. (1945-09-15) female who presents with chief complaint: numbness of feet with bluish discoloration for 6 months. She also describes her feet as always being "cold." She also notes constant numbness and pain of the lateral right lower leg that is worse at night and with walking. She denies any thigh or calf claudication. She is able to ambulate with her walker for about a block before she has to stop due to her back pain from spinal stenosis. She denies any rest pain. She denies any swelling or heaviness of her lower extremities. There is a family history of varicose veins. She denies any history of non-healing wounds or skin changes. She has lost 70 pounds over the past three years by "watching what I eat."   She has a past medical history of COPD. She is a former smoker. She has hypertension managed on HCTZ. She does not have diabetes or hyperlipidemia. She takes a daily aspirin.   Past Medical History  Diagnosis Date  . Hypertension   . COPD (chronic obstructive pulmonary disease)   . Cancer     skin    Past Surgical History: denies prior procedures  History   Social History  . Marital Status: Widowed    Spouse Name: N/A    Number of Children: N/A  . Years of Education: N/A   Occupational History  . Managment    Social History Main Topics  . Smoking status: Former Smoker -- 2.00 packs/day for 39 years    Types: Cigarettes    Quit date: 04/03/2004  . Smokeless tobacco: Not on file  . Alcohol Use: 2.4 oz/week    4 Cans of beer per week     Comment: Social  . Drug Use: No  . Sexual Activity: Not on file   Other Topics Concern  . Not on file   Social History Narrative    Family History  Problem Relation Age of Onset  . Emphysema Mother     smoked  . Lung cancer  Mother     smoked  . Varicose Veins Mother   . Emphysema Father     smoked  . Heart disease Father   . Diabetes Father   . Hyperlipidemia Father   . Hypertension Father   . Heart attack Father   . Asthma Sister   . Diabetes Sister   . Heart disease Sister   . Diabetes Brother   . Hypertension Brother   . Heart attack Daughter   . Cancer Son     Current Outpatient Prescriptions on File Prior to Visit  Medication Sig Dispense Refill  . albuterol (PROVENTIL HFA;VENTOLIN HFA) 108 (90 BASE) MCG/ACT inhaler Inhale 2 puffs into the lungs every 6 (six) hours as needed for wheezing or shortness of breath.    . hydrochlorothiazide (MICROZIDE) 12.5 MG capsule Take 25 mg by mouth daily.     Marland Kitchen HYDROcodone-acetaminophen (NORCO) 10-325 MG per tablet Take 1 tablet by mouth every 6 (six) hours as needed.      Marland Kitchen SPIRIVA HANDIHALER 18 MCG inhalation capsule INHALE ONE DOSE EVERY DAY 30 capsule 0  . aspirin 81 MG tablet Take 81 mg by mouth daily.      . Fluticasone Furoate-Vilanterol (BREO ELLIPTA) 100-25 MCG/INH AEPB Inhale 1 puff into the lungs daily.    Marland Kitchen  PARoxetine (PAXIL) 10 MG tablet Take 10 mg by mouth daily.     No current facility-administered medications on file prior to visit.    Allergies  Allergen Reactions  . Ace Inhibitors     Cough    REVIEW OF SYSTEMS:  (Positives checked otherwise negative)  CARDIOVASCULAR:  []  chest pain, []  chest pressure, []  palpitations, []  shortness of breath when laying flat, [x]  shortness of breath with exertion,  []  pain in feet when walking, []  pain in feet when laying flat, []  history of blood clot in veins (DVT), []  history of phlebitis, []  swelling in legs, []  varicose veins  PULMONARY:  []  productive cough, []  asthma, []  wheezing  NEUROLOGIC:  [x]  weakness in arms or legs, [x]  numbness in arms or legs, []  difficulty speaking or slurred speech, []  temporary loss of vision in one eye, []  dizziness  HEMATOLOGIC:  []  bleeding problems, []  problems  with blood clotting too easily  MUSCULOSKEL:  []  joint pain, []  joint swelling  GASTROINTEST:  []  vomiting blood, []  blood in stool     GENITOURINARY:  []  burning with urination, []  blood in urine  PSYCHIATRIC:  []  history of major depression  INTEGUMENTARY:  []  rashes, []  ulcers  CONSTITUTIONAL:  []  fever, []  chills   Physical Examination Filed Vitals:   06/05/14 1244  BP: 119/78  Pulse: 107  Height: 6' (1.829 m)  Weight: 226 lb 3.2 oz (102.604 kg)  SpO2: 96%   Body mass index is 30.67 kg/(m^2).  General: A&O x 3, obese female in NAD  Head: [place]/AT  Ear/Nose/Throat: Hearing grossly intact, nares w/o erythema or drainage, oropharynx w/o Erythema/Exudate  Eyes: PERRLA  Neck: Supple, no nuchal rigidity, no palpable LAD  Pulmonary: Sym exp, good air movt, CTAB, no rales, rhonchi, & wheezing  Cardiac: RRR, Nl S1, S2, no Murmurs, rubs or gallops  Vascular: Vessel Right Left  Radial Palpable Palpable  Carotid Palpable, without bruit Palpable, without bruit  Aorta Not palpable N/A  Femoral Palpable Palpable  Popliteal Not palpable Not palpable  PT Not palpable Not palpable  DP Faintly palpable Faintly palpable   Gastrointestinal: soft, NTND, -G/R, - HSM, - masses  Musculoskeletal: M/S 5/5 throughout. Feet with bluish discoloration. Pale upon elevation. Brisk lower extremity capillary refill. Extremities without ischemic changes. Negative straight leg test.   Neurologic: CN 2-12 intact. Pain and light touch intact in extremities. Motor exam as listed above  Psychiatric: Judgment intact, Mood & affect appropriate for pt's clinical situation  Dermatologic: See M/S exam for extremity exam, no rashes otherwise noted  Lymph:  No palpable cervical LAD  Medical Decision Making  Pura Picinich Wechter is a 68 y.o. female who presents with: BLE chronic venous insufficiency and bilateral lower extremity paresthesias of likely spinal etiology, given her history of spinal  stenosis.  Based on the patient's history and examination, she does not appear to have arterial insufficiency and is not at risk for limb loss. She will have ABIs to further her evaluate her arterial function although it is suspected that they will be normal.  She will also follow up with venous reflux studies. She is scheduled to see Dr. Bridgett Larsson back in the office on 06/26/14.    Virgina Jock, PA-C [facility] Office: [phone] Pager: [phone]  06/05/2014, 1:35 PM  This patient was seen and examined in conjunction with Dr. Bridgett Larsson  Addendum  I have independently interviewed and examined the patient, and I agree with the physician assistant's  findings.  Pt's sx are not consistent with either arterial or venous insufficiency.  Her positional dependent cyanosis in her feet is c/w chronic venous insufficiency.  Will obtain confirmatory studies and ABI as I can't feel strong pulses in the feet.  Adele Barthel, MD [facility] [place] [facility] Office: [phone] Pager: [phone]  06/05/2014, 5:33 PM

## 2014-06-09 ENCOUNTER — Encounter: Payer: Medicare Other | Admitting: Vascular Surgery

## 2014-06-09 ENCOUNTER — Ambulatory Visit (HOSPITAL_COMMUNITY): Payer: Medicare Other

## 2014-06-11 ENCOUNTER — Ambulatory Visit (HOSPITAL_COMMUNITY): Payer: Medicare Other

## 2014-06-15 ENCOUNTER — Ambulatory Visit (INDEPENDENT_AMBULATORY_CARE_PROVIDER_SITE_OTHER)
Admission: RE | Admit: 2014-06-15 | Discharge: 2014-06-15 | Disposition: A | Discharge status: Home / Self Care | Payer: Medicare Other | Source: Ambulatory Visit | Attending: Surgery | Admitting: Surgery

## 2014-06-15 ENCOUNTER — Ambulatory Visit (HOSPITAL_COMMUNITY)
Admission: RE | Admit: 2014-06-15 | Discharge: 2014-06-15 | Disposition: A | Discharge status: Home / Self Care | Payer: Medicare Other | Source: Ambulatory Visit | Attending: Surgery | Admitting: Surgery

## 2014-06-15 DIAGNOSIS — I872 Venous insufficiency (chronic) (peripheral): Secondary | ICD-10-CM | POA: Diagnosis present

## 2014-06-15 DIAGNOSIS — M79609 Pain in unspecified limb: Secondary | ICD-10-CM | POA: Insufficient documentation

## 2014-06-16 ENCOUNTER — Ambulatory Visit (HOSPITAL_COMMUNITY): Payer: Medicare Other

## 2014-06-18 ENCOUNTER — Ambulatory Visit (HOSPITAL_COMMUNITY): Payer: Medicare Other

## 2014-06-23 ENCOUNTER — Ambulatory Visit (HOSPITAL_COMMUNITY): Payer: Medicare Other

## 2014-06-25 ENCOUNTER — Encounter: Payer: Self-pay | Admitting: Vascular Surgery

## 2014-06-25 ENCOUNTER — Ambulatory Visit (HOSPITAL_COMMUNITY): Payer: Medicare Other

## 2014-06-26 ENCOUNTER — Encounter: Payer: Self-pay | Admitting: Vascular Surgery

## 2014-06-26 ENCOUNTER — Ambulatory Visit (INDEPENDENT_AMBULATORY_CARE_PROVIDER_SITE_OTHER): Payer: Medicare Other | Admitting: Vascular Surgery

## 2014-06-26 VITALS — BP 123/70 | HR 79 | Ht 72.0 in | Wt 228.0 lb

## 2014-06-26 DIAGNOSIS — I872 Venous insufficiency (chronic) (peripheral): Secondary | ICD-10-CM

## 2014-06-26 NOTE — Progress Notes (Signed)
    Established Venous Insufficiency  History of Present Illness  Susan Frye is a 68 y.o. (1946-06-13) female who presents with chief complaint: back pain.  The patient's symptoms have not progressed.  The patient's symptoms are: back pain with walking extended distances and bilateral feet cyanotis.  The patient's treatment regimen currently included: maximal medical management.  She returns from her ABI and B venous reflux  The patient's PMH, PSH, SH, FamHx, Med, and Allergies are unchanged from 06/05/14.  On ROS today: no intermittent claudication , no rest pain or ulcers  Physical Examination  Filed Vitals:   06/26/14 0911  BP: 123/70  Pulse: 79  Height: 6' (1.829 m)  Weight: 228 lb (103.42 kg)  SpO2: 95%   Body mass index is 30.92 kg/(m^2).  General: A&O x 3, obese female in NAD  Pulmonary: Sym exp, good air movt, CTAB, no rales, rhonchi, & wheezing  Cardiac: RRR, Nl S1, S2, no Murmurs, rubs or gallops  Vascular: Vessel Right Left  Radial Palpable Palpable  Carotid Palpable, without bruit Palpable, without bruit  Aorta Not palpable N/A  Femoral Palpable Palpable  Popliteal Not palpable Not palpable  PT Not palpable Not palpable  DP Faintly palpable Faintly palpable   Gastrointestinal: soft, NTND, -G/R, - HSM, - masses  Musculoskeletal: M/S 5/5 throughout. Feet with mild cyanosis today, Extremities without ischemic changes.   Neurologic: Pain and light touch intact in extremities. Motor exam as listed above   Non-Invasive Vascular Imaging  ABI (Date: 06/26/2014)  R: 1.13, DP: BI, PT: BI  L: 1.14, DP: BI, PT: BI   BLE Venous Insufficiency Duplex (Date: 06/26/2014):   RLE: NO DVT and SVT, no GSV reflux, + deep venous reflux: CFV, PV  LLE: no DVT and SVT, no GSV reflux, + deep venous reflux: CFV, FV, PV  Medical Decision Making  Susan Frye is a 68 y.o. female who presents with: B leg chronic venous insufficiency (C2), No  evidence of hemodynamically significant PAD   Based on the patient's vascular studies and examination, I have offered the patient: compressive therapy.  I discussed with the patient the use of her 20-30 mm thigh high compression stockings.  Thank you for allowing [place] to participate in this patient's care.  Adele Barthel, MD [facility] Office: [phone] Pager: [phone]  06/26/2014, 9:28 AM

## 2014-06-30 ENCOUNTER — Ambulatory Visit (HOSPITAL_COMMUNITY): Payer: Medicare Other

## 2014-07-02 ENCOUNTER — Ambulatory Visit (HOSPITAL_COMMUNITY): Payer: Medicare Other

## 2014-07-07 ENCOUNTER — Ambulatory Visit (HOSPITAL_COMMUNITY): Payer: Medicare Other

## 2014-07-09 ENCOUNTER — Ambulatory Visit (HOSPITAL_COMMUNITY): Payer: Medicare Other

## 2014-07-14 ENCOUNTER — Ambulatory Visit (HOSPITAL_COMMUNITY): Payer: Medicare Other

## 2014-07-16 ENCOUNTER — Ambulatory Visit (HOSPITAL_COMMUNITY): Payer: Medicare Other

## 2014-07-21 ENCOUNTER — Ambulatory Visit (HOSPITAL_COMMUNITY): Payer: Medicare Other

## 2014-07-23 ENCOUNTER — Ambulatory Visit (HOSPITAL_COMMUNITY): Payer: Medicare Other

## 2014-07-28 ENCOUNTER — Ambulatory Visit (HOSPITAL_COMMUNITY): Payer: Medicare Other

## 2014-07-30 ENCOUNTER — Ambulatory Visit (HOSPITAL_COMMUNITY): Payer: Medicare Other

## 2014-08-04 ENCOUNTER — Ambulatory Visit (HOSPITAL_COMMUNITY): Payer: Medicare Other

## 2014-08-06 ENCOUNTER — Ambulatory Visit (HOSPITAL_COMMUNITY): Payer: Medicare Other

## 2014-08-11 ENCOUNTER — Emergency Department (HOSPITAL_COMMUNITY)
Admission: EM | Admit: 2014-08-11 | Discharge: 2014-08-11 | Disposition: A | Discharge status: Home / Self Care | Payer: Medicare Other | Attending: Emergency Medicine | Admitting: Emergency Medicine

## 2014-08-11 ENCOUNTER — Ambulatory Visit (HOSPITAL_COMMUNITY): Payer: Medicare Other

## 2014-08-11 ENCOUNTER — Encounter (HOSPITAL_COMMUNITY): Payer: Self-pay | Admitting: *Deleted

## 2014-08-11 DIAGNOSIS — Z85828 Personal history of other malignant neoplasm of skin: Secondary | ICD-10-CM | POA: Insufficient documentation

## 2014-08-11 DIAGNOSIS — Z87891 Personal history of nicotine dependence: Secondary | ICD-10-CM | POA: Insufficient documentation

## 2014-08-11 DIAGNOSIS — J449 Chronic obstructive pulmonary disease, unspecified: Secondary | ICD-10-CM | POA: Insufficient documentation

## 2014-08-11 DIAGNOSIS — R42 Dizziness and giddiness: Secondary | ICD-10-CM

## 2014-08-11 DIAGNOSIS — F419 Anxiety disorder, unspecified: Secondary | ICD-10-CM | POA: Diagnosis not present

## 2014-08-11 DIAGNOSIS — Z79899 Other long term (current) drug therapy: Secondary | ICD-10-CM | POA: Insufficient documentation

## 2014-08-11 DIAGNOSIS — I10 Essential (primary) hypertension: Secondary | ICD-10-CM | POA: Insufficient documentation

## 2014-08-11 LAB — COMPREHENSIVE METABOLIC PANEL
ALT: 19 U/L (ref 0–35)
AST: 28 U/L (ref 0–37)
Albumin: 3.9 g/dL (ref 3.5–5.2)
Alkaline Phosphatase: 73 U/L (ref 39–117)
Anion gap: 8 (ref 5–15)
BUN: 19 mg/dL (ref 6–23)
CALCIUM: 9.1 mg/dL (ref 8.4–10.5)
CO2: 30 mmol/L (ref 19–32)
Chloride: 98 mEq/L (ref 96–112)
Creatinine, Ser: 0.72 mg/dL (ref 0.50–1.10)
GFR calc Af Amer: >90 mL/min (ref >90)
GFR, EST NON AFRICAN AMERICAN: 85 mL/min — AB (ref >90)
Glucose, Bld: 109 mg/dL — ABNORMAL HIGH (ref 70–99)
Potassium: 3.8 mmol/L (ref 3.5–5.1)
Sodium: 136 mmol/L (ref 135–145)
Total Bilirubin: 0.7 mg/dL (ref 0.3–1.2)
Total Protein: 6.8 g/dL (ref 6.0–8.3)

## 2014-08-11 LAB — CBC WITH DIFFERENTIAL/PLATELET
BASOS PCT: 1 % (ref 0–1)
Basophils Absolute: 0 10*3/uL (ref 0.0–0.1)
EOS ABS: 0.5 10*3/uL (ref 0.0–0.7)
EOS PCT: 7 % — AB (ref 0–5)
HCT: 39 % (ref 36.0–46.0)
HEMOGLOBIN: 13 g/dL (ref 12.0–15.0)
LYMPHS ABS: 1.2 10*3/uL (ref 0.7–4.0)
LYMPHS PCT: 17 % (ref 12–46)
MCH: 30 pg (ref 26.0–34.0)
MCHC: 33.3 g/dL (ref 30.0–36.0)
MCV: 89.9 fL (ref 78.0–100.0)
Monocytes Absolute: 0.7 10*3/uL (ref 0.1–1.0)
Monocytes Relative: 10 % (ref 3–12)
NEUTROS ABS: 4.9 10*3/uL (ref 1.7–7.7)
NEUTROS PCT: 65 % (ref 43–77)
PLATELETS: 173 10*3/uL (ref 150–400)
RBC: 4.34 MIL/uL (ref 3.87–5.11)
RDW: 13.9 % (ref 11.5–15.5)
WBC: 7.4 10*3/uL (ref 4.0–10.5)

## 2014-08-11 LAB — I-STAT TROPONIN, ED: Troponin i, poc: 0.01 ng/mL (ref 0.00–0.08)

## 2014-08-11 LAB — LIPASE, BLOOD: Lipase: 22 U/L (ref 11–59)

## 2014-08-11 MED ORDER — SODIUM CHLORIDE 0.9 % IV BOLUS (SEPSIS)
1000.0000 mL | Freq: Once | INTRAVENOUS | Status: AC
  Administered 2014-08-11: 1000 mL via INTRAVENOUS

## 2014-08-11 MED ORDER — LORAZEPAM 2 MG/ML IJ SOLN
1.0000 mg | Freq: Once | INTRAMUSCULAR | Status: AC
  Administered 2014-08-11: 1 mg via INTRAVENOUS
  Filled 2014-08-11: qty 1

## 2014-08-11 MED ORDER — ALPRAZOLAM 0.25 MG PO TABS: 0.2500 mg | ORAL_TABLET | Freq: Three times a day (TID) | ORAL | Status: DC | PRN

## 2014-08-11 NOTE — ED Notes (Signed)
Per EMS: pt coming from home with c/o anxiety. Pt reported to medics not feeling well and feeling very anxious for a week. Pt denies chest pain, pt was given 324 asa and 4 mg zofran. Pt denies nausea. Pt A&Ox4, respirations equal and unlabored, skin warm and dry

## 2014-08-11 NOTE — ED Notes (Signed)
MD at bedside. 

## 2014-08-11 NOTE — Discharge Instructions (Signed)
Take xanax as needed for anxiety.   Continue your current medications.   Follow up with your doctor.   Return to ER if you have panic attacks, chest pain, shortness of breath, passing out, worse dizziness.

## 2014-08-11 NOTE — ED Provider Notes (Signed)
CSN: [id number]     Arrival date & time 08/11/14  2125 History   First MD Initiated Contact with Patient 08/11/14 2129     Chief Complaint  Patient presents with  . Anxiety  . Dizziness     (Consider location/radiation/quality/duration/timing/severity/associated sxs/prior Treatment) The history is provided by the patient.  Susan Frye is a 69 y.o. female hx of HTN, COPD here with dizziness, anxiety. She has been feeling anxious the last week. She states that she has been under a lot of stress. Today she was coming home from a meeting and had some dizziness and lightheadedness. She feels like she is going to pass out but didn't pass out. Denies any chest pain shortness of breath. Has been having some nausea for the last week. Denies any fevers or chills or abdominal pain. Has history COPD but denies any shortness of breath. She saw her primary care doctor several days ago and was started on Wellbutrin for anxiety. Denies suicidal or homicidal ideations. Given ASA 325 mg by EMS and zofran 4mg  but has no chest pain.    Past Medical History  Diagnosis Date  . Hypertension   . COPD (chronic obstructive pulmonary disease)   . Cancer     skin   History reviewed. No pertinent past surgical history. Family History  Problem Relation Age of Onset  . Emphysema Mother     smoked  . Lung cancer Mother     smoked  . Varicose Veins Mother   . Emphysema Father     smoked  . Heart disease Father   . Diabetes Father   . Hyperlipidemia Father   . Hypertension Father   . Heart attack Father   . Asthma Sister   . Diabetes Sister   . Heart disease Sister   . Diabetes Brother   . Hypertension Brother   . Heart attack Daughter   . Cancer Son    History  Substance Use Topics  . Smoking status: Former Smoker -- 2.00 packs/day for 39 years    Types: Cigarettes    Quit date: 04/03/2004  . Smokeless tobacco: Not on file  . Alcohol Use: 2.4 oz/week    4 Cans of beer per week     Comment:  Social   OB History    No data available     Review of Systems  Neurological: Positive for dizziness.  Psychiatric/Behavioral: The patient is nervous/anxious.   All other systems reviewed and are negative.     Allergies  Ace inhibitors  Home Medications   Prior to Admission medications   Medication Sig Start Date End Date Taking? Authorizing Provider  albuterol (PROVENTIL HFA;VENTOLIN HFA) 108 (90 BASE) MCG/ACT inhaler Inhale 2 puffs into the lungs every 6 (six) hours as needed for wheezing or shortness of breath.   Yes Historical Provider, MD  BuPROPion HCl (WELLBUTRIN PO) Take 1 tablet by mouth daily.   Yes Historical Provider, MD  hydrochlorothiazide (MICROZIDE) 12.5 MG capsule Take 25 mg by mouth daily.    Yes Historical Provider, MD  HYDROcodone-acetaminophen (NORCO) 10-325 MG per tablet Take 1 tablet by mouth every 6 (six) hours as needed.     Yes Historical Provider, MD  SPIRIVA HANDIHALER 18 MCG inhalation capsule INHALE ONE DOSE EVERY DAY 06/28/12  Yes Tanda Rockers, MD   BP 146/72 mmHg  Pulse 80  Temp(Src) 97.9 F (36.6 C) (Oral)  Resp 16  Ht 6' (1.829 m)  Wt 230 lb (104.327 kg)  BMI  31.19 kg/m2  SpO2 95% Physical Exam  Constitutional: She is oriented to person, place, and time.  Anxious   HENT:  Head: Normocephalic and atraumatic.  Mouth/Throat: Oropharynx is clear and moist.  Eyes: Conjunctivae and EOM are normal. Pupils are equal, round, and reactive to light.  No nystagmus   Neck: Normal range of motion. Neck supple.  Cardiovascular: Normal rate, regular rhythm and normal heart sounds.   Pulmonary/Chest: Effort normal and breath sounds normal. No respiratory distress. She has no wheezes. She has no rales.  Abdominal: Soft. Bowel sounds are normal. She exhibits no distension. There is no tenderness. There is no rebound and no guarding.  Musculoskeletal: Normal range of motion. She exhibits no edema or tenderness.  Neurological: She is alert and oriented  to person, place, and time. No cranial nerve deficit. Coordination normal.  CN 2-12 intact. Nl strength throughout. Nl finger to nose. No pronator drift.   Skin: Skin is warm and dry.  Psychiatric: She has a normal mood and affect. Her behavior is normal. Judgment and thought content normal.  Nursing note and vitals reviewed.   ED Course  Procedures (including critical care time) Labs Review Labs Reviewed  CBC WITH DIFFERENTIAL - Abnormal; Notable for the following:    Eosinophils Relative 7 (*)    All other components within normal limits  COMPREHENSIVE METABOLIC PANEL - Abnormal; Notable for the following:    Glucose, Bld 109 (*)    GFR calc non Af Amer 85 (*)    All other components within normal limits  LIPASE, BLOOD  I-STAT TROPOININ, ED    Imaging Review No results found.   EKG Interpretation   Date/Time:  Tuesday August 11 2014 21:33:22 EST Ventricular Rate:  78 PR Interval:  147 QRS Duration: 92 QT Interval:  395 QTC Calculation: 450 R Axis:   83 Text Interpretation:  Sinus arrhythmia Borderline right axis deviation  sinus arrhythmia new since previous Confirmed by Fynlee Rowlands  MD, Tesneem Dufrane ([id number]) on  08/11/2014 9:37:37 PM      MDM   Final diagnoses:  None    Susan Frye is a 69 y.o. female here with dizziness, anxiety. I doubt posterior stroke. Symptoms likely from anxiety. I doubt ACS and symptoms for about a week. Will get labs, trop x 1 sufficient.   11:12 PM  Labs stable. Felt better after ativan. Not orthostatic. Will dc home with prn xanax for anxiety.    Wandra Arthurs, MD 08/11/14 [phone]

## 2014-08-13 ENCOUNTER — Ambulatory Visit (HOSPITAL_COMMUNITY): Payer: Medicare Other

## 2014-08-18 ENCOUNTER — Ambulatory Visit (HOSPITAL_COMMUNITY): Payer: Medicare Other

## 2014-08-20 ENCOUNTER — Ambulatory Visit (HOSPITAL_COMMUNITY): Payer: Medicare Other

## 2014-08-24 ENCOUNTER — Encounter (HOSPITAL_COMMUNITY): Payer: Self-pay

## 2014-08-24 NOTE — Progress Notes (Signed)
Review chart per pulmonary rehab f/u

## 2014-08-25 ENCOUNTER — Ambulatory Visit (HOSPITAL_COMMUNITY): Payer: Medicare Other

## 2014-08-27 ENCOUNTER — Ambulatory Visit (HOSPITAL_COMMUNITY): Payer: Medicare Other

## 2014-09-01 ENCOUNTER — Ambulatory Visit (HOSPITAL_COMMUNITY): Payer: Medicare Other

## 2014-09-03 ENCOUNTER — Ambulatory Visit (INDEPENDENT_AMBULATORY_CARE_PROVIDER_SITE_OTHER): Payer: Medicare Other | Admitting: Psychiatry

## 2014-09-03 ENCOUNTER — Ambulatory Visit (HOSPITAL_COMMUNITY): Payer: Medicare Other

## 2014-09-03 DIAGNOSIS — F4321 Adjustment disorder with depressed mood: Secondary | ICD-10-CM

## 2014-09-08 ENCOUNTER — Ambulatory Visit (HOSPITAL_COMMUNITY): Payer: Medicare Other

## 2014-09-10 ENCOUNTER — Ambulatory Visit (HOSPITAL_COMMUNITY): Payer: Medicare Other

## 2014-09-15 ENCOUNTER — Ambulatory Visit (HOSPITAL_COMMUNITY): Payer: Medicare Other

## 2014-09-16 ENCOUNTER — Encounter: Payer: Self-pay | Admitting: Pulmonary Disease

## 2014-09-16 ENCOUNTER — Ambulatory Visit (INDEPENDENT_AMBULATORY_CARE_PROVIDER_SITE_OTHER): Payer: Medicare Other | Admitting: Pulmonary Disease

## 2014-09-16 VITALS — BP 98/58 | HR 88 | Ht 71.0 in | Wt 222.8 lb

## 2014-09-16 DIAGNOSIS — I10 Essential (primary) hypertension: Secondary | ICD-10-CM

## 2014-09-16 DIAGNOSIS — J449 Chronic obstructive pulmonary disease, unspecified: Secondary | ICD-10-CM

## 2014-09-16 NOTE — Assessment & Plan Note (Signed)
Ask PCP about NEURONTIN or LYRICA for neuropathy pain We will place  Request for [facility] appt Recheck BP & consider stopping thiazide if remains low

## 2014-09-16 NOTE — Assessment & Plan Note (Signed)
Stay on spiriva Pulmonary rehab referral

## 2014-09-16 NOTE — Patient Instructions (Signed)
Stay on spiriva Pulmonary rehab referral Ask PCP about NEURONTIN or LYRICA for neuropathy pain We will place  Request for [facility] appt Recheck BP & consider stopping thiazide if remains low

## 2014-09-16 NOTE — Progress Notes (Signed)
   Subjective:    Patient ID: Susan Frye, female    DOB: Mar 22, 1946, 69 y.o.   MRN: [id number]  HPI  79 yowf with gold C COPD ,quit smoking 03/2004 presents for followup of dyspnea.  Her husband passed away in Oct 20, 2012 after a stroke and she reports mild depression since then. She lost significant weight from 256 pounds in the last 2 years intentionally.  She reports chronic back pain due to spinal stenosis.  Significant tests/ events  PFT's 12/10/03 FEV1 1.12 (36%) ratio 49  - PFT's 05/31/2011 1.31 (46%) ratio 50 and 13% p B2 with DLC0 40%  Spirometry 04/2013 - unchanged FEV1 at 1.28-44% and FVC of 2.09-53% with ratio 61.   Low risk myocardial perfusion scan 2014   12/2013 Acute OV ,D-dimer negative, CT angio neg  6.8.15 ONO >> ++ ONO, continue on O2 at bedtime 2lpm she goes to her daughter's home "a lot" in [place] and was provided a portable concentrator that goes up to 2L. Has started pulm rehab- feels more energetic Could not tell a difference with breo - spiriva helped No sadness of mood   09/16/2014  Chief Complaint  Patient presents with  . Follow-up    Patient complaining of nerve damage in legs and neuropathy in both feet; breathing doing okay.     ED visit 08/11/14   Diagnosed as 'panic attack" K 3.8 , on hctz, BP low today Has moved to [place] -not very happy around 'old people' 'My body is just not right' Would like to change PCP Sister died -was on hospice, best friend died C/o pain in feet - circulation ok per ABI .  Reports breathing is doing well since last ov.  Finished pulm rehab and thinking about joining maintenance program. Taking Spiriva daily  Did not care for BREO .  Remains on O2 At bedtime And with activity As needed   Review of Systems neg for any significant sore throat, dysphagia, itching, sneezing, nasal congestion or excess/ purulent secretions, fever, chills, sweats, unintended wt loss, pleuritic or exertional cp,  hempoptysis, orthopnea pnd or change in chronic leg swelling. Also denies presyncope, palpitations, heartburn, abdominal pain, nausea, vomiting, diarrhea or change in bowel or urinary habits, dysuria,hematuria, rash, arthralgias, visual complaints, headache, numbness weakness or ataxia.     Objective:   Physical Exam  Gen. Pleasant, obese, in no distress ENT - no lesions, no post nasal drip Neck: No JVD, no thyromegaly, no carotid bruits Lungs: no use of accessory muscles, no dullness to percussion, decreased without rales or rhonchi  Cardiovascular: Rhythm regular, heart sounds  normal, no murmurs or gallops, no peripheral edema Musculoskeletal: No deformities, no cyanosis or clubbing , no tremors        Assessment & Plan:

## 2014-09-17 ENCOUNTER — Ambulatory Visit (HOSPITAL_COMMUNITY): Payer: Medicare Other

## 2014-09-29 ENCOUNTER — Telehealth (HOSPITAL_COMMUNITY): Payer: Self-pay

## 2014-09-29 NOTE — Telephone Encounter (Signed)
Called patient regarding entrance to Pulmonary Rehab.  Patient states that they are interested in attending the program.  Susan Frye is going to verify insurance coverage and follow up.

## 2014-10-16 ENCOUNTER — Telehealth (HOSPITAL_COMMUNITY): Payer: Self-pay

## 2014-10-16 NOTE — Telephone Encounter (Signed)
I have called and left a message with A[facility] to inquire about participation in Pulmonary Rehab per Dr. Bari Mantis referral. Will send letter in mail and follow up.

## 2014-11-09 ENCOUNTER — Telehealth (HOSPITAL_COMMUNITY): Payer: Self-pay

## 2014-11-09 NOTE — Telephone Encounter (Signed)
I have called and left a message with [facility] to inquire about participation in Pulmonary Rehab per Dr. Bari Mantis referral. Will send letter in mail and follow up.

## 2014-11-09 NOTE — Telephone Encounter (Signed)
Called patient in regards to Pulmonary Rehab referral.  Patient has moved to [facility] which is closer to [facility].  I have faxed her referral over to [facility].  Patient is aware and is expecting a call.

## 2014-11-10 ENCOUNTER — Telehealth: Payer: Self-pay | Admitting: Family Medicine

## 2014-11-10 NOTE — Telephone Encounter (Signed)
Patient has Medicare and [employer]. She is currently on spirivia, ventolin, hctz, lorazepam, and gabapentin. Appointment scheduled for May 19th with Dr. Sabra Heck. Patient aware she is to bring insurance cards, photo id, all medication and arrive 20 minutes before appointment.

## 2014-11-24 ENCOUNTER — Encounter (HOSPITAL_COMMUNITY): Payer: Medicare Other

## 2014-11-24 ENCOUNTER — Ambulatory Visit (HOSPITAL_COMMUNITY): Payer: Medicare Other

## 2014-11-26 ENCOUNTER — Telehealth (HOSPITAL_COMMUNITY): Payer: Self-pay

## 2014-11-26 NOTE — Telephone Encounter (Signed)
Called patient to discuss Pulmonary Rehab.  Patient was interested in attending [facility] because she thought it was closer to her new home in [place].  Patient states that it is 30 miles to [place] and 30 miles to [employer].  Patient states that she is just not up to that kind of travel at this point in time.  Patient states that she doesn't want to completely count herself out but at this time, she will not be attending.  Patient encouraged to contact us in the future if her situation changes.

## 2014-12-10 ENCOUNTER — Ambulatory Visit: Payer: Self-pay | Admitting: Family Medicine

## 2014-12-17 ENCOUNTER — Encounter: Payer: Self-pay | Admitting: Family Medicine

## 2015-01-21 ENCOUNTER — Other Ambulatory Visit (HOSPITAL_COMMUNITY): Payer: Self-pay | Admitting: Family Medicine

## 2015-03-18 ENCOUNTER — Encounter: Payer: Self-pay | Admitting: Adult Health

## 2015-03-18 ENCOUNTER — Ambulatory Visit (INDEPENDENT_AMBULATORY_CARE_PROVIDER_SITE_OTHER): Payer: Medicare Other | Admitting: Adult Health

## 2015-03-18 VITALS — BP 128/64 | HR 92 | Temp 98.6°F | Ht 72.0 in | Wt 232.0 lb

## 2015-03-18 DIAGNOSIS — R0902 Hypoxemia: Secondary | ICD-10-CM

## 2015-03-18 DIAGNOSIS — J449 Chronic obstructive pulmonary disease, unspecified: Secondary | ICD-10-CM

## 2015-03-18 NOTE — Assessment & Plan Note (Signed)
Recent COPD exacerbation, slowly resolving. Patient is to finish her prednisone taper Discussed taking Spiriva on a daily basis  Plan Increase Spiriva daily .  Continue on Symbicort 2 puffs Twice daily  .  Finish Prednisone taper as planned  Continue on Oxygen 2l. At At bedtime   Follow up Dr. Elsworth Soho  In 3-4 months and As needed

## 2015-03-18 NOTE — Assessment & Plan Note (Signed)
Nocturnal hypoxemia . She is doing well on oxygen at bedtime She did have some ambulatory desaturations in the past. O2 saturations were good on arrival to the office today.

## 2015-03-18 NOTE — Progress Notes (Signed)
   Subjective:    Patient ID: Susan Frye, female    DOB: [birth year], 69 y.o.   MRN: [id number]  HPI 70 yowf with gold C COPD ,quit smoking 03/2004 presents for followup of dyspnea.  Her husband passed away in 2012-10-20 after a stroke and she reports mild depression since then. She lost significant weight from 256 pounds in the last 2 years intentionally.  She reports chronic back pain due to spinal stenosis.  Significant tests/ events  PFT's 12/10/03 FEV1 1.12 (36%) ratio 49  - PFT's 05/31/2011 1.31 (46%) ratio 50 and 13% p B2 with DLC0 40%  Spirometry 04/2013 - unchanged FEV1 at 1.28-44% and FVC of 2.09-53% with ratio 61.   Low risk myocardial perfusion scan 2014   12/2013 Acute OV ,D-dimer negative, CT angio neg  6.8.15 ONO >> ++ ONO, continue on O2 at bedtime 2lpm she goes to her daughter's home "a lot" in [place] and was provided a portable concentrator that goes up to 2L. Has started pulm rehab- feels more energetic Could not tell a difference with breo - spiriva helped No sadness of mood   .  >>Did not care for BREO .     03/18/2015 Follow up : COPD and nocturnal oxygen dependent Patient returns for a six-month follow-up. She says over the last week that her breathing has not been doing well. She's been having increased wheezing and albuterol use. She was seen by her primary care physician and started on Symbicort twice daily along with a prednisone taper. She admits that she's only been taking Spiriva every other day. Patient says she's been under a lot of stress. She has lost her sister and her best friend last year and her grandson has recently had to quit college and has a baby on the way. Patient denies any chest pain, orthopnea, PND, increased leg swelling, hemoptysis.  PVX and Prevnar utd.   Remains on oxygen at bedtime. Feels that she does well with this.  Review of Systems  neg for any significant sore throat, dysphagia, itching, sneezing, nasal  congestion or excess/ purulent secretions, fever, chills, sweats, unintended wt loss, pleuritic or exertional cp, hempoptysis, orthopnea pnd or change in chronic leg swelling. Also denies presyncope, palpitations, heartburn, abdominal pain, nausea, vomiting, diarrhea or change in bowel or urinary habits, dysuria,hematuria, rash, arthralgias, visual complaints, headache, numbness weakness or ataxia.     Objective:   Physical Exam   Gen. Pleasant, obese, in no distress ENT - no lesions, no post nasal drip Neck: No JVD, no thyromegaly, no carotid bruits Lungs: no use of accessory muscles, no dullness to percussion, decreased without rales or rhonchi  Cardiovascular: Rhythm regular, heart sounds  normal, no murmurs or gallops, no peripheral edema Musculoskeletal: No deformities, no cyanosis or clubbing , no tremors        Assessment & Plan:

## 2015-03-18 NOTE — Patient Instructions (Signed)
Increase Spiriva daily .  Continue on Symbicort 2 puffs Twice daily  .  Finish Prednisone taper as planned  Continue on Oxygen 2l. At At bedtime   Follow up Dr. Elsworth Soho  In 3-4 months and As needed

## 2015-03-19 NOTE — Progress Notes (Signed)
Reviewed & agree with plan  

## 2015-05-05 ENCOUNTER — Telehealth: Payer: Self-pay | Admitting: Pulmonary Disease

## 2015-05-05 NOTE — Telephone Encounter (Signed)
Spoke with pt, states she's been on prednisone X2 days through her PCP for shortness of breath.  Pt wanted to make our office aware that she is taking this.  Nothing further needed.

## 2015-05-06 ENCOUNTER — Telehealth: Payer: Self-pay | Admitting: Pulmonary Disease

## 2015-05-06 ENCOUNTER — Encounter: Payer: Self-pay | Admitting: Pulmonary Disease

## 2015-05-06 ENCOUNTER — Ambulatory Visit (INDEPENDENT_AMBULATORY_CARE_PROVIDER_SITE_OTHER): Payer: Medicare Other | Admitting: Pulmonary Disease

## 2015-05-06 VITALS — BP 118/62 | HR 77 | Temp 97.4°F | Ht 72.0 in | Wt 230.8 lb

## 2015-05-06 DIAGNOSIS — J449 Chronic obstructive pulmonary disease, unspecified: Secondary | ICD-10-CM | POA: Diagnosis not present

## 2015-05-06 MED ORDER — ALBUTEROL SULFATE (2.5 MG/3ML) 0.083% IN NEBU: INHALATION_SOLUTION | RESPIRATORY_TRACT | Status: DC

## 2015-05-06 NOTE — Telephone Encounter (Signed)
Notes printed from last OV and faxed to [place]. Nothing further needed.

## 2015-05-06 NOTE — Patient Instructions (Signed)
Continue to take the spiriva regularly We will order albuterol nebulizer. Finish the course of prednisone.  Return to clinic to see Dr. Elsworth Soho.
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
## 2015-05-06 NOTE — Telephone Encounter (Signed)
Last ov with TP on 8.25.16  Called and spoke with pt. She c/o SOB with any activity and wheezing. She started a pred dosage pack and breathing treatment on 10.10.16 and feels that the prednisone 10mg  is not helping. She stated the neb treatment worked good for 12 hours but is no longer of any help. I offered her an ov with PM on 10.13.16 @ 11:45. Pt agreed to ov. She voiced understanding and had no further questions. Nothing further need and will sign off on message.

## 2015-05-07 ENCOUNTER — Telehealth: Payer: Self-pay | Admitting: Pulmonary Disease

## 2015-05-07 NOTE — Telephone Encounter (Signed)
lmtcb x1 

## 2015-05-09 NOTE — Progress Notes (Signed)
Reviewed & agree with plan  

## 2015-05-10 NOTE — Telephone Encounter (Signed)
Called # provided, received answering service. WCB

## 2015-05-10 NOTE — Telephone Encounter (Signed)
Called and spoke to [facility] at [employer] ([place]). Insurance will not cover the albuterol neb med if it states "as needed". Rx changed to every 4-6 hours, Rose verbalized understanding. Called and informed pt the albuterol will only need to be taken as needed and the rx is through [facility] and will be shipped to her home, not through [place]. Pt verbalized understanding and denied any further questions or concerns at this time.

## 2015-05-10 NOTE — Telephone Encounter (Signed)
[phone]   Pt got her neb machine but her insurance will not cover the meds due to how it was written she uses [facility] [employer]

## 2015-06-10 ENCOUNTER — Other Ambulatory Visit: Payer: Self-pay

## 2015-06-10 DIAGNOSIS — Z1231 Encounter for screening mammogram for malignant neoplasm of breast: Secondary | ICD-10-CM

## 2015-06-24 ENCOUNTER — Emergency Department (HOSPITAL_COMMUNITY): Payer: Medicare Other

## 2015-06-24 ENCOUNTER — Encounter (HOSPITAL_COMMUNITY): Payer: Self-pay | Admitting: Nurse Practitioner

## 2015-06-24 ENCOUNTER — Inpatient Hospital Stay (HOSPITAL_COMMUNITY)
Admission: EM | Admit: 2015-06-24 | Discharge: 2015-06-29 | DRG: 190 | Disposition: A | Discharge status: Home Health | Payer: Medicare Other | Attending: Internal Medicine | Admitting: Internal Medicine

## 2015-06-24 DIAGNOSIS — Z7952 Long term (current) use of systemic steroids: Secondary | ICD-10-CM | POA: Diagnosis not present

## 2015-06-24 DIAGNOSIS — M545 Low back pain: Secondary | ICD-10-CM | POA: Diagnosis present

## 2015-06-24 DIAGNOSIS — R079 Chest pain, unspecified: Secondary | ICD-10-CM | POA: Diagnosis not present

## 2015-06-24 DIAGNOSIS — G8929 Other chronic pain: Secondary | ICD-10-CM | POA: Diagnosis present

## 2015-06-24 DIAGNOSIS — J441 Chronic obstructive pulmonary disease with (acute) exacerbation: Secondary | ICD-10-CM | POA: Diagnosis not present

## 2015-06-24 DIAGNOSIS — Z79899 Other long term (current) drug therapy: Secondary | ICD-10-CM

## 2015-06-24 DIAGNOSIS — M549 Dorsalgia, unspecified: Secondary | ICD-10-CM | POA: Diagnosis not present

## 2015-06-24 DIAGNOSIS — Z825 Family history of asthma and other chronic lower respiratory diseases: Secondary | ICD-10-CM | POA: Diagnosis not present

## 2015-06-24 DIAGNOSIS — M4800 Spinal stenosis, site unspecified: Secondary | ICD-10-CM | POA: Diagnosis present

## 2015-06-24 DIAGNOSIS — F32A Depression, unspecified: Secondary | ICD-10-CM | POA: Diagnosis present

## 2015-06-24 DIAGNOSIS — J9621 Acute and chronic respiratory failure with hypoxia: Secondary | ICD-10-CM | POA: Diagnosis not present

## 2015-06-24 DIAGNOSIS — Z833 Family history of diabetes mellitus: Secondary | ICD-10-CM

## 2015-06-24 DIAGNOSIS — Z8249 Family history of ischemic heart disease and other diseases of the circulatory system: Secondary | ICD-10-CM

## 2015-06-24 DIAGNOSIS — Z801 Family history of malignant neoplasm of trachea, bronchus and lung: Secondary | ICD-10-CM

## 2015-06-24 DIAGNOSIS — Z87891 Personal history of nicotine dependence: Secondary | ICD-10-CM

## 2015-06-24 DIAGNOSIS — I10 Essential (primary) hypertension: Secondary | ICD-10-CM | POA: Diagnosis present

## 2015-06-24 DIAGNOSIS — I872 Venous insufficiency (chronic) (peripheral): Secondary | ICD-10-CM | POA: Diagnosis present

## 2015-06-24 DIAGNOSIS — F329 Major depressive disorder, single episode, unspecified: Secondary | ICD-10-CM | POA: Diagnosis present

## 2015-06-24 DIAGNOSIS — R0602 Shortness of breath: Secondary | ICD-10-CM | POA: Diagnosis present

## 2015-06-24 HISTORY — DX: Depression, unspecified: F32.A

## 2015-06-24 HISTORY — DX: Major depressive disorder, single episode, unspecified: F32.9

## 2015-06-24 LAB — I-STAT TROPONIN, ED: Troponin i, poc: 0 ng/mL (ref 0.00–0.08)

## 2015-06-24 LAB — CBC WITH DIFFERENTIAL/PLATELET
Basophils Absolute: 0 10*3/uL (ref 0.0–0.1)
Basophils Relative: 0 %
EOS PCT: 0 %
Eosinophils Absolute: 0 10*3/uL (ref 0.0–0.7)
HCT: 43.2 % (ref 36.0–46.0)
Hemoglobin: 14 g/dL (ref 12.0–15.0)
Lymphocytes Relative: 4 %
Lymphs Abs: 0.4 10*3/uL — ABNORMAL LOW (ref 0.7–4.0)
MCH: 29.8 pg (ref 26.0–34.0)
MCHC: 32.4 g/dL (ref 30.0–36.0)
MCV: 91.9 fL (ref 78.0–100.0)
Monocytes Absolute: 0.2 10*3/uL (ref 0.1–1.0)
Monocytes Relative: 2 %
Neutro Abs: 9.9 10*3/uL — ABNORMAL HIGH (ref 1.7–7.7)
Neutrophils Relative %: 94 %
Platelets: 190 10*3/uL (ref 150–400)
RBC: 4.7 MIL/uL (ref 3.87–5.11)
RDW: 13.6 % (ref 11.5–15.5)
WBC: 10.5 10*3/uL (ref 4.0–10.5)

## 2015-06-24 LAB — BASIC METABOLIC PANEL
Anion gap: 8 (ref 5–15)
BUN: 15 mg/dL (ref 6–20)
CO2: 30 mmol/L (ref 22–32)
Calcium: 9.7 mg/dL (ref 8.9–10.3)
Chloride: 99 mmol/L — ABNORMAL LOW (ref 101–111)
Creatinine, Ser: 0.67 mg/dL (ref 0.44–1.00)
GFR calc Af Amer: >60 mL/min (ref >60)
GFR calc non Af Amer: >60 mL/min (ref >60)
Glucose, Bld: 156 mg/dL — ABNORMAL HIGH (ref 65–99)
POTASSIUM: 4 mmol/L (ref 3.5–5.1)
Sodium: 137 mmol/L (ref 135–145)

## 2015-06-24 LAB — BRAIN NATRIURETIC PEPTIDE: B NATRIURETIC PEPTIDE 5: 50.7 pg/mL (ref 0.0–100.0)

## 2015-06-24 LAB — D-DIMER, QUANTITATIVE: D-Dimer, Quant: 0.56 ug/mL-FEU — ABNORMAL HIGH (ref 0.00–0.50)

## 2015-06-24 MED ORDER — TIOTROPIUM BROMIDE MONOHYDRATE 18 MCG IN CAPS: 18.0000 ug | ORAL_CAPSULE | Freq: Every day | RESPIRATORY_TRACT | Status: DC

## 2015-06-24 MED ORDER — IPRATROPIUM-ALBUTEROL 0.5-2.5 (3) MG/3ML IN SOLN
3.0000 mL | Freq: Three times a day (TID) | RESPIRATORY_TRACT | Status: DC
  Administered 2015-06-25 – 2015-06-29 (×12): 3 mL via RESPIRATORY_TRACT
  Filled 2015-06-24 (×12): qty 3

## 2015-06-24 MED ORDER — IPRATROPIUM-ALBUTEROL 0.5-2.5 (3) MG/3ML IN SOLN
3.0000 mL | Freq: Once | RESPIRATORY_TRACT | Status: AC
  Administered 2015-06-24: 3 mL via RESPIRATORY_TRACT
  Filled 2015-06-24: qty 3

## 2015-06-24 MED ORDER — GABAPENTIN 100 MG PO CAPS
200.0000 mg | ORAL_CAPSULE | Freq: Every day | ORAL | Status: DC
  Administered 2015-06-24 – 2015-06-28 (×5): 200 mg via ORAL
  Filled 2015-06-24 (×5): qty 2

## 2015-06-24 MED ORDER — METHYLPREDNISOLONE SODIUM SUCC 125 MG IJ SOLR
60.0000 mg | Freq: Two times a day (BID) | INTRAMUSCULAR | Status: DC
  Administered 2015-06-25 – 2015-06-28 (×7): 60 mg via INTRAVENOUS
  Filled 2015-06-24 (×8): qty 0.96

## 2015-06-24 MED ORDER — DM-GUAIFENESIN ER 30-600 MG PO TB12
1.0000 | ORAL_TABLET | Freq: Two times a day (BID) | ORAL | Status: DC
  Administered 2015-06-24 – 2015-06-29 (×10): 1 via ORAL
  Filled 2015-06-24 (×10): qty 1

## 2015-06-24 MED ORDER — HYDROCHLOROTHIAZIDE 25 MG PO TABS
25.0000 mg | ORAL_TABLET | Freq: Every day | ORAL | Status: DC
  Administered 2015-06-25 – 2015-06-26 (×2): 25 mg via ORAL
  Filled 2015-06-24 (×2): qty 1

## 2015-06-24 MED ORDER — ALBUTEROL SULFATE (2.5 MG/3ML) 0.083% IN NEBU: 5.0000 mg | INHALATION_SOLUTION | Freq: Once | RESPIRATORY_TRACT | Status: DC

## 2015-06-24 MED ORDER — IPRATROPIUM-ALBUTEROL 0.5-2.5 (3) MG/3ML IN SOLN
3.0000 mL | RESPIRATORY_TRACT | Status: DC
Start: 2015-06-24 — End: 2015-06-24
  Administered 2015-06-24: 3 mL via RESPIRATORY_TRACT
  Filled 2015-06-24: qty 3

## 2015-06-24 MED ORDER — AZITHROMYCIN 500 MG PO TABS
500.0000 mg | ORAL_TABLET | Freq: Every day | ORAL | Status: AC
  Administered 2015-06-24: 500 mg via ORAL
  Filled 2015-06-24: qty 1

## 2015-06-24 MED ORDER — METHYLPREDNISOLONE SODIUM SUCC 125 MG IJ SOLR: 60.0000 mg | Freq: Two times a day (BID) | INTRAMUSCULAR | Status: DC

## 2015-06-24 MED ORDER — ENOXAPARIN SODIUM 40 MG/0.4ML ~~LOC~~ SOLN
40.0000 mg | SUBCUTANEOUS | Status: DC
  Administered 2015-06-24 – 2015-06-28 (×5): 40 mg via SUBCUTANEOUS
  Filled 2015-06-24 (×5): qty 0.4

## 2015-06-24 MED ORDER — HYDROCHLOROTHIAZIDE 12.5 MG PO CAPS: 25.0000 mg | ORAL_CAPSULE | Freq: Every day | ORAL | Status: DC

## 2015-06-24 MED ORDER — ALBUTEROL SULFATE (2.5 MG/3ML) 0.083% IN NEBU: 2.5000 mg | INHALATION_SOLUTION | RESPIRATORY_TRACT | Status: DC | PRN

## 2015-06-24 MED ORDER — ALBUTEROL (5 MG/ML) CONTINUOUS INHALATION SOLN
2.5000 mg/h | INHALATION_SOLUTION | RESPIRATORY_TRACT | Status: DC
  Administered 2015-06-24: 2.5 mg/h via RESPIRATORY_TRACT
  Filled 2015-06-24: qty 20

## 2015-06-24 MED ORDER — AZITHROMYCIN 250 MG PO TABS
250.0000 mg | ORAL_TABLET | Freq: Every day | ORAL | Status: AC
  Administered 2015-06-25 – 2015-06-28 (×4): 250 mg via ORAL
  Filled 2015-06-24 (×4): qty 1

## 2015-06-24 MED ORDER — METHYLPREDNISOLONE SODIUM SUCC 125 MG IJ SOLR
125.0000 mg | Freq: Once | INTRAMUSCULAR | Status: AC
  Administered 2015-06-24: 125 mg via INTRAVENOUS
  Filled 2015-06-24: qty 2

## 2015-06-24 MED ORDER — HYDROCODONE-ACETAMINOPHEN 10-325 MG PO TABS
1.0000 | ORAL_TABLET | Freq: Four times a day (QID) | ORAL | Status: DC | PRN
  Administered 2015-06-25 – 2015-06-29 (×6): 1 via ORAL
  Filled 2015-06-24 (×7): qty 1

## 2015-06-24 MED ORDER — PAROXETINE HCL 20 MG PO TABS
20.0000 mg | ORAL_TABLET | Freq: Every day | ORAL | Status: DC
  Administered 2015-06-25 – 2015-06-28 (×4): 20 mg via ORAL
  Filled 2015-06-24 (×4): qty 1

## 2015-06-24 NOTE — ED Notes (Signed)
Ambulated pt with pulse oximetry, O2 SATS dropped to as low as 77% with pt showing obvious signs of exertion, states she only uses O2 at home at night.

## 2015-06-24 NOTE — ED Provider Notes (Signed)
Medical screening examination/treatment/procedure(s) were conducted as a shared visit with non-physician practitioner(s) and myself.  I personally evaluated the patient during the encounter.   EKG Interpretation   Date/Time:  Thursday June 24 2015 16:44:24 EST Ventricular Rate:  96 PR Interval:  181 QRS Duration: 81 QT Interval:  363 QTC Calculation: 459 R Axis:   87 Text Interpretation:  Sinus rhythm Borderline right axis deviation  Anteroseptal infarct, old Baseline wander in lead(s) II III aVF No  significant change since last tracing Confirmed by Dmitriy Gair  MD, Anida Deol  ([id number]) on 06/24/2015 6:11:15 PM     Patient here with increasing shortness of breath and has history of COPD. Chest x-ray noted and without evidence of infiltrate or edema. D-dimer elevated at 0.56 but age adjusted is within normal limits. Patient given albuterol treatment here along with Solu-Medrol and was ambulated and continues to be hypoxic with her sats drop into the 80s. We'll admit for treatment of COPD exacerbation  Lacretia Leigh, MD 06/24/15 1945

## 2015-06-24 NOTE — ED Provider Notes (Signed)
CSN: [id number]     Arrival date & time 06/24/15  1633 History   First MD Initiated Contact with Patient 06/24/15 1653     Chief Complaint  Patient presents with  . Shortness of Breath    HPI   Susan Frye is an 69 y.o. female with a history of COPD with nocturnal hypoxemia and HTN who presents to the ED for evaluation of cough and SOB. She states that she started having a cough different from baseline about one week ago and was prescribed a course of doxycycline and prednisone by her PCP. She states she finished the doxy but accidentally dropped some of the prednisone down the sink and so only took half of the course of prednisone. She states she continues to feel short of breath. She states that she has been using her albuterol rescue inhaler daily, last use three hours ago, with no relief. States that her cough is productive of yellowish brown sputum and it is different from her baseline COPD cough. Denies fever, chills, chest pain, N/V, abd pain. She does follow up with pulmonology regularly, last visit in 04/2015. Pt had reportedly not been taking Spiriva regularly due to cost and was encouraged at that visit to use it as prescribed. Pt states she has been using it. Pt reports that she uses 2L O2 via [place] at night but does not usually require daytime oxygen. She states she went to PCP earlier today for f/u of her symptoms and she reportedly was hypoxic to the mid 70s on room air and so was sent to the ER.   Past Medical History  Diagnosis Date  . Hypertension   . COPD (chronic obstructive pulmonary disease) ([place])   . Cancer ([place])     skin   History reviewed. No pertinent past surgical history. Family History  Problem Relation Age of Onset  . Emphysema Mother     smoked  . Lung cancer Mother     smoked  . Varicose Veins Mother   . Emphysema Father     smoked  . Heart disease Father   . Diabetes Father   . Hyperlipidemia Father   . Hypertension Father   . Heart attack Father   .  Asthma Sister   . Diabetes Sister   . Heart disease Sister   . Diabetes Brother   . Hypertension Brother   . Heart attack Daughter   . Cancer Son    Social History  Substance Use Topics  . Smoking status: Former Smoker -- 2.00 packs/day for 39 years    Types: Cigarettes    Quit date: 04/03/2004  . Smokeless tobacco: None  . Alcohol Use: 2.4 oz/week    4 Cans of beer per week     Comment: Social   OB History    No data available     Review of Systems  All other systems reviewed and are negative.     Allergies  Ace inhibitors  Home Medications   Prior to Admission medications   Medication Sig Start Date End Date Taking? Authorizing Provider  albuterol (PROVENTIL) (2.5 MG/3ML) 0.083% nebulizer solution One vial in neb. Every 4-6 hours as needed for wheezing or SOB dx. j44.9 05/06/15   Praveen Mannam, MD  hydrochlorothiazide (MICROZIDE) 12.5 MG capsule Take 25 mg by mouth daily.     Historical Provider, MD  HYDROcodone-acetaminophen (NORCO) 10-325 MG per tablet Take 1 tablet by mouth every 6 (six) hours as needed.      Historical  Provider, MD  OXYGEN Inhale 2 L into the lungs at bedtime.    Historical Provider, MD  predniSONE (DELTASONE) 10 MG tablet Take 10 mg by mouth daily with breakfast.    Historical Provider, MD  SPIRIVA HANDIHALER 18 MCG inhalation capsule INHALE ONE DOSE EVERY DAY 06/28/12   Tanda Rockers, MD   BP 155/84 mmHg  Pulse 93  Temp(Src) 98.1 F (36.7 C) (Oral)  Resp 18  SpO2 98% Physical Exam  Constitutional: She is oriented to person, place, and time. No distress. Nasal cannula in place.  HENT:  Right Ear: External ear normal.  Left Ear: External ear normal.  Nose: Nose normal.  Mouth/Throat: Oropharynx is clear and moist. No oropharyngeal exudate.  Eyes: Conjunctivae and EOM are normal. Pupils are equal, round, and reactive to light.  Neck: Normal range of motion. Neck supple.  Cardiovascular: Normal rate, regular rhythm, normal heart sounds  and intact distal pulses.   Pulmonary/Chest: Effort normal. No stridor. No respiratory distress. She has decreased breath sounds.  Bilateral diffuse end expiratory wheezes. Bilateral scattered soft crackles.   Abdominal: Soft. Bowel sounds are normal. She exhibits no distension. There is no tenderness. There is no rebound and no guarding.  Musculoskeletal: Normal range of motion. She exhibits no edema.  Lymphadenopathy:    She has no cervical adenopathy.  Neurological: She is alert and oriented to person, place, and time. No cranial nerve deficit or sensory deficit.  Intact UE strength and sensation. Good grip strength. Bilateral LE have slightly decreased strength symmetrically.   Skin: Skin is warm and dry. She is not diaphoretic.  Psychiatric: She has a normal mood and affect.  Nursing note and vitals reviewed.   ED Course  Procedures (including critical care time) Labs Review Labs Reviewed  CBC WITH DIFFERENTIAL/PLATELET - Abnormal; Notable for the following:    Neutro Abs 9.9 (*)    Lymphs Abs 0.4 (*)    All other components within normal limits  BASIC METABOLIC PANEL - Abnormal; Notable for the following:    Chloride 99 (*)    Glucose, Bld 156 (*)    All other components within normal limits  D-DIMER, QUANTITATIVE (NOT AT [facility]) - Abnormal; Notable for the following:    D-Dimer, Quant 0.56 (*)    All other components within normal limits  CULTURE, EXPECTORATED SPUTUM-ASSESSMENT  GRAM STAIN  CULTURE, RESPIRATORY (NON-EXPECTORATED)  BRAIN NATRIURETIC PEPTIDE  INFLUENZA PANEL BY PCR (TYPE A & B, H1N1)  STREP PNEUMONIAE URINARY ANTIGEN  I-STAT TROPOININ, ED    Imaging Review Dg Chest 2 View  06/24/2015  CLINICAL DATA:  Cough, shortness of breath and congestion for 1 week EXAM: CHEST  2 VIEW COMPARISON:  12/25/2013 FINDINGS: The heart is normal in size and stable. There is tortuosity and calcification of the thoracic aorta. The pulmonary hila appear normal. There are  emphysematous changes and pulmonary scarring but no infiltrates or effusions. Stable calcified granuloma in the right middle lobe. IMPRESSION: Chronic lung changes but no acute pulmonary findings. Electronically Signed   By: Marijo Sanes M.D.   On: 06/24/2015 17:33   I have personally reviewed and evaluated these images and lab results as part of my medical decision-making.   EKG Interpretation   Date/Time:  Thursday June 24 2015 16:44:24 EST Ventricular Rate:  96 PR Interval:  181 QRS Duration: 81 QT Interval:  363 QTC Calculation: 459 R Axis:   87 Text Interpretation:  Sinus rhythm Borderline right axis deviation  Anteroseptal infarct, old  Baseline wander in lead(s) II III aVF No  significant change since last tracing Confirmed by ALLEN  MD, ANTHONY  ([id number]) on 06/24/2015 6:11:15 PM      MDM   Final diagnoses:  COPD exacerbation ([place])  Depression   Pt given one duoneb, solu medrol, and continuous albuterol in the ED. She still de-sats to 76-80% on RA with ambulation. Her CXR is unremarkable. I originally ordered a d-dimer as had low pre-test probability for PE. D-dimer 0.56. HOwever, can age adjust given pt's age. Will hold off on CT PE for now as I believe pt to have a COPD exacerbation and not PE. Workup otherwise unrevealing. Will call hospitalist for admission for COPD exacerbation.  Spoke to Dr. Blaine Hamper. Will admit.    Anne Ng, PA-C 06/25/15 1431

## 2015-06-24 NOTE — H&P (Signed)
[facility] History and Physical  Susan Frye [id number] DOB: 1945/11/24 DOA: 06/24/2015  Referring physician: ED physician PCP: Woody Seller, MD  Specialists:   Chief Complaint: Productive cough and shortness of breath  HPI: Susan Frye is a 69 y.o. female with PMH of former smoker, hypertension, COPD, depression, skin cancer, chronic back pain, who presents with productive cough and shortness of breath.  Patient reports that she has been having worsening shortness of breath for more than 10 days. She also has productive cough with brownish colored sputum production. She does not have fever, chills, chest pain. No tenderness over calf areas. She reports that she was treated by her PCP with doxycycline and prednisone last week, without significant improvement. She still has severe shortness of breath and productive cough. Patient does not have abdominal pain, diarrhea, symptoms of UTI unilateral weakness. She states that she has chronic lower back pain which has not changed.  In ED, patient was found to have oxygen desaturation to 77% on room air, WBC 10.5, temperature normal, slightly tachycardia, negative troponin, electrolytes and renal function okay. Chest x-ray has no infiltration. D-dimer 0.56.  Where does patient live?   At home   SNF    Assistant living facility   Retirement center Can patient participate in ADLs?  Yes     Barely     None  Little     Some   Review of Systems:   General: no fevers, chills, no changes in body weight, has fatigue HEENT: no blurry vision, hearing changes or sore throat Pulm: no dyspnea, coughing, wheezing CV: no chest pain, palpitations Abd: no nausea, vomiting, abdominal pain, diarrhea, constipation GU: no dysuria, burning on urination, increased urinary frequency, hematuria  Ext: no leg edema Neuro: no unilateral weakness, numbness, or tingling, no vision change or hearing loss Skin: no rash MSK: No muscle spasm, no deformity,  no limitation of range of movement in spin Heme: No easy bruising.  Travel history: No recent long distant travel.  Allergy:  Allergies  Allergen Reactions  . Ace Inhibitors     Cough    Past Medical History  Diagnosis Date  . Hypertension   . COPD (chronic obstructive pulmonary disease) ([place])   . Cancer ([place])     skin  . Depression     Past Surgical History  Procedure Laterality Date  . Cosmetic surgery      eye cosmetic surgery    Social History:  reports that she quit smoking about 11 years ago. Her smoking use included Cigarettes. She has a 78 pack-year smoking history. She does not have any smokeless tobacco history on file. She reports that she drinks about 2.4 oz of alcohol per week. She reports that she does not use illicit drugs.  Family History:  Family History  Problem Relation Age of Onset  . Emphysema Mother     smoked  . Lung cancer Mother     smoked  . Varicose Veins Mother   . Emphysema Father     smoked  . Heart disease Father   . Diabetes Father   . Hyperlipidemia Father   . Hypertension Father   . Heart attack Father   . Asthma Sister   . Diabetes Sister   . Heart disease Sister   . Diabetes Brother   . Hypertension Brother   . Heart attack Daughter   . Cancer Son      Prior to Admission medications   Medication Sig Start Date End  Date Taking? Authorizing Provider  albuterol (PROVENTIL HFA;VENTOLIN HFA) 108 (90 BASE) MCG/ACT inhaler Inhale 2 puffs into the lungs every 6 (six) hours as needed for wheezing or shortness of breath.   Yes Historical Provider, MD  albuterol (PROVENTIL) (2.5 MG/3ML) 0.083% nebulizer solution One vial in neb. Every 4-6 hours as needed for wheezing or SOB dx. j44.9 05/06/15  Yes Praveen Mannam, MD  gabapentin (NEURONTIN) 100 MG capsule Take 200 mg by mouth at bedtime.   Yes Historical Provider, MD  hydrochlorothiazide (MICROZIDE) 12.5 MG capsule Take 25 mg by mouth daily.    Yes Historical Provider, MD   HYDROcodone-acetaminophen (NORCO) 10-325 MG per tablet Take 1 tablet by mouth every 6 (six) hours as needed.     Yes Historical Provider, MD  OXYGEN Inhale 2 L into the lungs at bedtime.   Yes Historical Provider, MD  PARoxetine (PAXIL) 20 MG tablet Take 20 mg by mouth daily.   Yes Historical Provider, MD  predniSONE (DELTASONE) 10 MG tablet Take 20 mg by mouth daily with breakfast. For 3 Days Then Stop.   Yes Historical Provider, MD  [facility] HANDIHALER 18 MCG inhalation capsule INHALE ONE DOSE EVERY DAY 06/28/12  Yes Tanda Rockers, MD    Physical Exam: Filed Vitals:   06/24/15 1855 06/24/15 1933 06/24/15 2052 06/24/15 2131  BP: 137/77  135/64   Pulse: 99  95   Temp:   98.2 F (36.8 C)   TempSrc: Oral  Oral   Resp: 20  16   Height:   6' (1.829 m)   Weight:   99 kg (218 lb 4.1 oz)   SpO2: 100% 80% 65% 91%   General: Not in acute distress HEENT:       Eyes: PERRL, EOMI, no scleral icterus.       ENT: No discharge from the ears and nose, no pharynx injection, no tonsillar enlargement.        Neck: No JVD, no bruit, no mass felt. Heme: No neck lymph node enlargement. Cardiac: S1/S2, RRR, No murmurs, No gallops or rubs. Pulm: Decreased air movement bilaterally. Has wheezing bilaterally, No rales or rubs. Abd: Soft, nondistended, nontender, no rebound pain, no organomegaly, BS present. Ext: No pitting leg edema bilaterally. 2+DP/PT pulse bilaterally. Musculoskeletal: No joint deformities, No joint redness or warmth, no limitation of ROM in spin. Skin: No rashes.  Neuro: Alert, oriented X3, cranial nerves II-XII grossly intact, muscle strength 5/5 in all extremities, sensation to light touch intact. Psych: Patient is not psychotic, no suicidal or hemocidal ideation.  Labs on Admission:  Basic Metabolic Panel:  Recent Labs Lab 06/24/15 1720  NA 137  K 4.0  CL 99*  CO2 30  GLUCOSE 156*  BUN 15  CREATININE 0.67  CALCIUM 9.7   Liver Function Tests: No results for input(s):  AST, ALT, ALKPHOS, BILITOT, PROT, ALBUMIN in the last 168 hours. No results for input(s): LIPASE, AMYLASE in the last 168 hours. No results for input(s): AMMONIA in the last 168 hours. CBC:  Recent Labs Lab 06/24/15 1720  WBC 10.5  NEUTROABS 9.9*  HGB 14.0  HCT 43.2  MCV 91.9  PLT 190   Cardiac Enzymes: No results for input(s): CKTOTAL, CKMB, CKMBINDEX, TROPONINI in the last 168 hours.  BNP (last 3 results)  Recent Labs  06/24/15 1720  BNP 50.7    ProBNP (last 3 results) No results for input(s): PROBNP in the last 8760 hours.  CBG: No results for input(s): GLUCAP in the last 168  hours.  Radiological Exams on Admission: Dg Chest 2 View  06/24/2015  CLINICAL DATA:  Cough, shortness of breath and congestion for 1 week EXAM: CHEST  2 VIEW COMPARISON:  12/25/2013 FINDINGS: The heart is normal in size and stable. There is tortuosity and calcification of the thoracic aorta. The pulmonary hila appear normal. There are emphysematous changes and pulmonary scarring but no infiltrates or effusions. Stable calcified granuloma in the right middle lobe. IMPRESSION: Chronic lung changes but no acute pulmonary findings. Electronically Signed   By: Marijo Sanes M.D.   On: 06/24/2015 17:33    EKG: Independently reviewed. QTC 459, no ischemic change  Assessment/Plan Principal Problem:   COPD exacerbation (HCC) Active Problems:   Essential hypertension   Chronic venous insufficiency   Acute on chronic respiratory failure with hypoxia (HCC)   Depression   Chronic back pain   Acute on chronic respiratory failure with hypoxia (HCC) due to COPD exacerbation ([place]): No infiltration on chest x-ray. D-dimer is elevated, but patient does not have signs of DVT, no chest pain, very unlikely to have pulmonary embolism. Her productive cough and wheezing on auscultation are consistent with COPD exacerbation.  -will admit patient to telemetry bed  -Nebulizers: scheduled Duoneb and prn  albuterol -continue home Spiriva inhaler -Solu-Medrol 60 mg IV bid -Oral azithromycin for 5 days.  -Mucinex for cough  -Urine S. pneumococcal antigen -Follow up sputum culture, Flu pcr  HTN: -HCTZ  Chronic back pain: -prn Norco and Neurontin  Depression: Stable, no suicidal or homicidal ideations. -Continue home medications: Paxil  DVT ppx: SQ Lovenox  Code Status: Full code Family Communication: None at bed side.   Disposition Plan: Admit to inpatient   Date of Service 06/24/2015    Ivor Costa [facility] Pager [phone]  If 7PM-7AM, please contact night-coverage [email or web address] Password [email or web address] 06/24/2015, 10:50 PM

## 2015-06-24 NOTE — ED Notes (Signed)
Bed: [id number] Expected date:  Expected time:  Means of arrival:  Comments: SOB

## 2015-06-24 NOTE — ED Notes (Signed)
Pt c/o shortness of breath, seen at her PCP but they were unable to obtain a chest XR as pt was too weak to walk. Obvious crackles and productive cough which pt reports brown color phlegm. Hx COPD.

## 2015-06-25 ENCOUNTER — Telehealth: Payer: Self-pay | Admitting: Pulmonary Disease

## 2015-06-25 DIAGNOSIS — I10 Essential (primary) hypertension: Secondary | ICD-10-CM

## 2015-06-25 DIAGNOSIS — J441 Chronic obstructive pulmonary disease with (acute) exacerbation: Principal | ICD-10-CM

## 2015-06-25 DIAGNOSIS — J9621 Acute and chronic respiratory failure with hypoxia: Secondary | ICD-10-CM

## 2015-06-25 LAB — INFLUENZA PANEL BY PCR (TYPE A & B)
H1N1 flu by pcr: NOT DETECTED
INFLAPCR: NEGATIVE
INFLBPCR: NEGATIVE

## 2015-06-25 LAB — EXPECTORATED SPUTUM ASSESSMENT W REFEX TO RESP CULTURE

## 2015-06-25 LAB — STREP PNEUMONIAE URINARY ANTIGEN: Strep Pneumo Urinary Antigen: NEGATIVE

## 2015-06-25 LAB — EXPECTORATED SPUTUM ASSESSMENT W GRAM STAIN, RFLX TO RESP C

## 2015-06-25 MED ORDER — ZOLPIDEM TARTRATE 5 MG PO TABS
5.0000 mg | ORAL_TABLET | Freq: Once | ORAL | Status: AC
  Administered 2015-06-25: 5 mg via ORAL
  Filled 2015-06-25: qty 1

## 2015-06-25 MED ORDER — ZOLPIDEM TARTRATE 5 MG PO TABS
5.0000 mg | ORAL_TABLET | Freq: Every evening | ORAL | Status: DC | PRN
  Administered 2015-06-25 – 2015-06-28 (×3): 5 mg via ORAL
  Filled 2015-06-25 (×3): qty 1

## 2015-06-25 MED ORDER — DEXTROSE 5 % IV SOLN
1.0000 g | INTRAVENOUS | Status: DC
  Administered 2015-06-25 – 2015-06-27 (×3): 1 g via INTRAVENOUS
  Filled 2015-06-25 (×3): qty 10

## 2015-06-25 NOTE — Telephone Encounter (Signed)
[phone], pt daughter cb

## 2015-06-25 NOTE — Telephone Encounter (Signed)
Called spoke with pt daughter in law [place].  Pt is currently hospitalized. Pt had a water leak couple of months ago and it still smells mildewy in her appt. Every since that is when it seems the SOB got worse. They had it professionally cleaned but you can still tell it has a certain smell in the appt. She wants to know if this could have caused her to get sick? Pt would deny smelling anything in the appt. Please advise Dr. Elsworth Soho thanks

## 2015-06-25 NOTE — Progress Notes (Signed)
PROGRESS NOTE  Susan Frye [id number] DOB: [birth year] DOA: 06/24/2015 PCP: Woody Seller, MD  HPI/Recap of past 24 hours:  Diffuse wheezing, son in room  Assessment/Plan: Principal Problem:   COPD exacerbation ([place]) Active Problems:   Essential hypertension   Chronic venous insufficiency   Acute on chronic respiratory failure with hypoxia (HCC)   Depression   Chronic back pain  copd exacerbation: persistent diffuse wheezing, reported was treated with abx and steroids, not better, add rocephin,continue zithro/nebs/steroids  HTN: -HCTZ  Chronic back pain: -prn Norco and Neurontin  Depression: Stable, no suicidal or homicidal ideations. -Continue home medications: Paxil  DVT ppx: SQ Lovenox  Code Status: full  Family Communication: patient and son  Disposition Plan: remain in the hospital   Consultants:  none  Procedures:  none  Antibiotics:  Rocephin  From 12/2  zithro from admission   Objective: BP 137/97 mmHg  Pulse 98  Temp(Src) 98.4 F (36.9 C) (Oral)  Resp 18  Ht 6' (1.829 m)  Wt 218 lb 4.1 oz (99 kg)  BMI 29.59 kg/m2  SpO2 92%  Intake/Output Summary (Last 24 hours) at 06/25/15 1953 Last data filed at 06/25/15 1759  Gross per 24 hour  Intake    720 ml  Output   1525 ml  Net   -805 ml   Filed Weights   06/24/15 2052  Weight: 218 lb 4.1 oz (99 kg)    Exam:   General:  NAD  Cardiovascular: RRR  Respiratory: diffuse bilateral wheezing  Abdomen: Soft/ND/NT, positive BS  Musculoskeletal: No Edema  Neuro: aaox3  Data Reviewed: Basic Metabolic Panel:  Recent Labs Lab 06/24/15 1720  NA 137  K 4.0  CL 99*  CO2 30  GLUCOSE 156*  BUN 15  CREATININE 0.67  CALCIUM 9.7   Liver Function Tests: No results for input(s): AST, ALT, ALKPHOS, BILITOT, PROT, ALBUMIN in the last 168 hours. No results for input(s): LIPASE, AMYLASE in the last 168 hours. No results for input(s): AMMONIA in the last 168  hours. CBC:  Recent Labs Lab 06/24/15 1720  WBC 10.5  NEUTROABS 9.9*  HGB 14.0  HCT 43.2  MCV 91.9  PLT 190   Cardiac Enzymes:   No results for input(s): CKTOTAL, CKMB, CKMBINDEX, TROPONINI in the last 168 hours. BNP (last 3 results)  Recent Labs  06/24/15 1720  BNP 50.7    ProBNP (last 3 results) No results for input(s): PROBNP in the last 8760 hours.  CBG: No results for input(s): GLUCAP in the last 168 hours.  Recent Results (from the past 240 hour(s))  Culture, expectorated sputum-assessment     Status: None   Collection Time: 06/25/15  1:47 PM  Result Value Ref Range Status   Specimen Description SPUTUM  Final   Special Requests NONE  Final   Sputum evaluation   Final    THIS SPECIMEN IS ACCEPTABLE. RESPIRATORY CULTURE REPORT TO FOLLOW.   Report Status 06/25/2015 FINAL  Final     Studies: No results found.  Scheduled Meds: . azithromycin  250 mg Oral Daily  . cefTRIAXone (ROCEPHIN)  IV  1 g Intravenous Q24H  . dextromethorphan-guaiFENesin  1 tablet Oral BID  . enoxaparin (LOVENOX) injection  40 mg Subcutaneous Q24H  . gabapentin  200 mg Oral QHS  . hydrochlorothiazide  25 mg Oral Daily  . ipratropium-albuterol  3 mL Nebulization TID  . methylPREDNISolone (SOLU-MEDROL) injection  60 mg Intravenous Q12H  . PARoxetine  20 mg Oral Daily  Continuous Infusions:    Time spent: 55mins  Ta Fair MD, PhD  [facility] Pager [phone]. If 7PM-7AM, please contact night-coverage at [email or web address], password [facility] 06/25/2015, 7:53 PM  LOS: 1 day

## 2015-06-25 NOTE — Evaluation (Signed)
Occupational Therapy Evaluation Patient Details Name: Susan Frye MRN: [id number] DOB: 02/13/1946 Today's Date: 06/25/2015    History of Present Illness pt was admitted for COPD exacerbation.  She has a h/o chronic back pain   Clinical Impression   This 69 year old female was admitted for the above. She will benefit from skilled OT to increase safety and independence with adls.  Pt was independent with adls prior to admission; goals in acute are for supervision level.    Follow Up Recommendations  Home health OT    Equipment Recommendations   (information given about tub transfer bench)    Recommendations for Other Services       Precautions / Restrictions Precautions Precautions: Fall Restrictions Weight Bearing Restrictions: No      Mobility Bed Mobility Overal bed mobility: Independent                Transfers                      Balance                                            ADL Overall ADL's : Needs assistance/impaired                                       General ADL Comments: pt can complete ADL with set up; crosses legs to reach feet.  Did not stand as pt had bleeding from IV site:  RN came in to fix this.   She had 2/4 dyspnea and cues given to take rest breaks.  Pt uses 02 at home only at night.  HR up to 120 sats 90.  Educated on energy conservation and tub bench for safety and energy conservation     Vision     Perception     Praxis      Pertinent Vitals/Pain Pain Assessment: No/denies pain     Hand Dominance     Extremity/Trunk Assessment Upper Extremity Assessment Upper Extremity Assessment: Overall WFL for tasks assessed           Communication Communication Communication: No difficulties   Cognition Arousal/Alertness: Awake/alert Behavior During Therapy: WFL for tasks assessed/performed Overall Cognitive Status: Within Functional Limits for tasks assessed                      General Comments       Exercises       Shoulder Instructions      Home Living Family/patient expects to be discharged to:: Private residence Living Arrangements: Alone Available Help at Discharge: Family Type of Home: House             Bathroom Shower/Tub: Tub/shower unit Shower/tub characteristics: Curtain Bathroom Toilet: Handicapped height     Home Equipment: Environmental consultant - 2 wheels (uses in the community)          Prior Functioning/Environment Level of Independence: Independent with assistive device(s);Independent             OT Diagnosis: Generalized weakness   OT Problem List: Decreased strength;Decreased activity tolerance;Decreased knowledge of use of DME or AE;Cardiopulmonary status limiting activity   OT Treatment/Interventions: Self-care/ADL training;DME and/or AE instruction;Energy conservation;Patient/family education;Balance training    OT Goals(Current goals can  be found in the care plan section) Acute Rehab OT Goals Patient Stated Goal: get back to baseline OT Goal Formulation: With patient Time For Goal Achievement: 07/02/15 Potential to Achieve Goals: Good ADL Goals Pt Will Transfer to Toilet: with modified independence;ambulating (comfort height commode) Pt Will Perform Tub/Shower Transfer: Tub transfer;ambulating;tub bench;with modified independence Additional ADL Goal #1: pt will initiate at least one rest break for energy conservation and verbalize 3 strategies  OT Frequency: Min 2X/week   Barriers to D/C:            Co-evaluation              End of Session    Activity Tolerance: Patient tolerated treatment well Patient left: in bed;with call bell/phone within reach;with family/visitor present   Time: [id number] OT Time Calculation (min): 17 min Charges:  OT General Charges $OT Visit: 1 Procedure OT Evaluation $Initial OT Evaluation Tier I: 1 Procedure G-Codes:    Barabara Motz 2015-07-17, 8:58  AM  Lesle Chris, OTR/L [phone] 07/17/15

## 2015-06-25 NOTE — Evaluation (Signed)
Physical Therapy Evaluation Patient Details Name: SHUNTERIA BISH MRN: [id number] DOB: [birth year] Today's Date: 06/25/2015   History of Present Illness  69 yo female admitted with COPD exac. Hx of HTN, COPD. skin cancer, chronic back pain.   Clinical Impression  On eval, pt required close supervision level assist for mobility-walked ~185 feet with RW. O2 sats 88% on 3L O2 during ambulation. Pt tolerated distance well. Dyspnea 2/4.     Follow Up Recommendations Home health PT    Equipment Recommendations  None recommended by PT    Recommendations for Other Services OT consult     Precautions / Restrictions Precautions Precautions: Fall Restrictions Weight Bearing Restrictions: No      Mobility  Bed Mobility Overal bed mobility: Modified Independent                Transfers Overall transfer level: Needs assistance   Transfers: Sit to/from Stand Sit to Stand: Supervision         General transfer comment: for safety.   Ambulation/Gait Ambulation/Gait assistance: Supervision Ambulation Distance (Feet): 185 Feet Assistive device: Rolling walker (2 wheeled) Gait Pattern/deviations: Step-through pattern     General Gait Details: for safety. O2 sats 88% 3L O2  Stairs            Wheelchair Mobility    Modified Rankin (Stroke Patients Only)       Balance Overall balance assessment: Needs assistance         Standing balance support: During functional activity Standing balance-Leahy Scale: Fair                               Pertinent Vitals/Pain Pain Assessment: No/denies pain    Home Living Family/patient expects to be discharged to:: Private residence Living Arrangements: Alone Available Help at Discharge: Family Type of Home: House         Home Equipment: Gilford Rile - 2 wheels      Prior Function Level of Independence: Independent with assistive device(s)         Comments: uses walker for balance     Hand  Dominance        Extremity/Trunk Assessment   Upper Extremity Assessment: Defer to OT evaluation           Lower Extremity Assessment: Generalized weakness      Cervical / Trunk Assessment: Normal  Communication   Communication: No difficulties  Cognition Arousal/Alertness: Awake/alert Behavior During Therapy: WFL for tasks assessed/performed Overall Cognitive Status: Within Functional Limits for tasks assessed                      General Comments      Exercises        Assessment/Plan    PT Assessment Patient needs continued PT services  PT Diagnosis Difficulty walking   PT Problem List Decreased balance;Decreased mobility;Decreased activity tolerance  PT Treatment Interventions DME instruction;Gait training;Therapeutic activities;Functional mobility training;Patient/family education;Balance training;Therapeutic exercise   PT Goals (Current goals can be found in the Care Plan section) Acute Rehab PT Goals Patient Stated Goal: get back to baseline PT Goal Formulation: With patient Time For Goal Achievement: 07/09/15 Potential to Achieve Goals: Good    Frequency Min 3X/week   Barriers to discharge        Co-evaluation               End of Session Equipment Utilized During Treatment: Gait belt;Oxygen Activity Tolerance:  Patient tolerated treatment well Patient left: in chair;with call bell/phone within reach           Time: 1150-1205 PT Time Calculation (min) (ACUTE ONLY): 15 min   Charges:   PT Evaluation $Initial PT Evaluation Tier I: 1 Procedure     PT G Codes:        Weston Anna, MPT Pager: [phone]

## 2015-06-25 NOTE — Telephone Encounter (Signed)
lmtcb x1 

## 2015-06-25 NOTE — Care Management Note (Signed)
Case Management Note  Patient Details  Name: Susan Frye MRN: [id number] Date of Birth: 06/02/1946  Subjective/Objective:  69 y/o f admitted w/COPD. From home.Await PT cons-recc.                  Action/Plan:d/c plan home.   Expected Discharge Date:   (unknown)               Expected Discharge Plan:  Home/Self Care  In-House Referral:     Discharge planning Services  CM Consult  Post Acute Care Choice:    Choice offered to:     DME Arranged:    DME Agency:     HH Arranged:    HH Agency:     Status of Service:  In process, will continue to follow  Medicare Important Message Given:    Date Medicare IM Given:    Medicare IM give by:    Date Additional Medicare IM Given:    Additional Medicare Important Message give by:     If discussed at [place] of Stay Meetings, dates discussed:    Additional Comments:  Dessa Phi, RN 06/25/2015, 1:30 PM

## 2015-06-26 LAB — COMPREHENSIVE METABOLIC PANEL
ALT: 15 U/L (ref 14–54)
AST: 20 U/L (ref 15–41)
Albumin: 3.4 g/dL — ABNORMAL LOW (ref 3.5–5.0)
Alkaline Phosphatase: 65 U/L (ref 38–126)
Anion gap: 7 (ref 5–15)
BILIRUBIN TOTAL: 0.8 mg/dL (ref 0.3–1.2)
BUN: 19 mg/dL (ref 6–20)
CHLORIDE: 99 mmol/L — AB (ref 101–111)
CO2: 32 mmol/L (ref 22–32)
CREATININE: 0.76 mg/dL (ref 0.44–1.00)
Calcium: 9.6 mg/dL (ref 8.9–10.3)
GFR calc Af Amer: >60 mL/min (ref >60)
GLUCOSE: 149 mg/dL — AB (ref 65–99)
Potassium: 5 mmol/L (ref 3.5–5.1)
Sodium: 138 mmol/L (ref 135–145)
Total Protein: 7 g/dL (ref 6.5–8.1)

## 2015-06-26 LAB — MRSA PCR SCREENING: MRSA by PCR: NEGATIVE

## 2015-06-26 LAB — MAGNESIUM: MAGNESIUM: 2.4 mg/dL (ref 1.7–2.4)

## 2015-06-26 MED ORDER — SODIUM CHLORIDE 0.9 % IV SOLN
INTRAVENOUS | Status: DC
  Administered 2015-06-26 (×2): via INTRAVENOUS

## 2015-06-26 MED ORDER — GI COCKTAIL ~~LOC~~
30.0000 mL | Freq: Three times a day (TID) | ORAL | Status: DC | PRN
  Filled 2015-06-26: qty 30

## 2015-06-26 MED ORDER — BENZONATATE 100 MG PO CAPS
100.0000 mg | ORAL_CAPSULE | Freq: Three times a day (TID) | ORAL | Status: DC | PRN
  Administered 2015-06-26: 100 mg via ORAL
  Filled 2015-06-26: qty 1

## 2015-06-26 MED ORDER — FAMOTIDINE 20 MG PO TABS
20.0000 mg | ORAL_TABLET | Freq: Two times a day (BID) | ORAL | Status: DC
  Administered 2015-06-26 – 2015-06-29 (×6): 20 mg via ORAL
  Filled 2015-06-26 (×6): qty 1

## 2015-06-26 NOTE — Progress Notes (Signed)
PROGRESS NOTE  Susan Frye [id number] DOB: 31-Dec-1945 DOA: 06/24/2015 PCP: Woody Seller, MD  HPI/Recap of past 24 hours:  Still wheeze, DOE when go to the bathroom  Assessment/Plan: Principal Problem:   COPD exacerbation ([place]) Active Problems:   Essential hypertension   Chronic venous insufficiency   Acute on chronic respiratory failure with hypoxia (HCC)   Depression   Chronic back pain  copd exacerbation:  persistent diffuse wheezing, reported was treated with abx and steroids, not better, add rocephin,continue zithro/nebs/steroids  HTN: -stable, d/c HCTZ for now due to report thick mucus, not able to cough it up, start gentle hydration.  Chronic back pain: -prn Norco and Neurontin  Depression: Stable, no suicidal or homicidal ideations. -Continue home medications: Paxil  DVT ppx: SQ Lovenox  Code Status: full  Family Communication: patient  Disposition Plan: remain in the hospital   Consultants:  none  Procedures:  none  Antibiotics:  Rocephin  From 12/2  zithro from admission   Objective: BP 146/73 mmHg  Pulse 84  Temp(Src) 98 F (36.7 C) (Oral)  Resp 20  Ht 6' (1.829 m)  Wt 218 lb 4.1 oz (99 kg)  BMI 29.59 kg/m2  SpO2 97%  Intake/Output Summary (Last 24 hours) at 06/26/15 1824 Last data filed at 06/26/15 1358  Gross per 24 hour  Intake    390 ml  Output   1100 ml  Net   -710 ml   Filed Weights   06/24/15 2052  Weight: 218 lb 4.1 oz (99 kg)    Exam:   General:  NAD  Cardiovascular: RRR  Respiratory: diffuse bilateral wheezing  Abdomen: Soft/ND/NT, positive BS  Musculoskeletal: No Edema  Neuro: aaox3  Data Reviewed: Basic Metabolic Panel:  Recent Labs Lab 06/24/15 1720 06/26/15 0530  NA 137 138  K 4.0 5.0  CL 99* 99*  CO2 30 32  GLUCOSE 156* 149*  BUN 15 19  CREATININE 0.67 0.76  CALCIUM 9.7 9.6  MG  --  2.4   Liver Function Tests:  Recent Labs Lab 06/26/15 0530  AST 20  ALT 15   ALKPHOS 65  BILITOT 0.8  PROT 7.0  ALBUMIN 3.4*   No results for input(s): LIPASE, AMYLASE in the last 168 hours. No results for input(s): AMMONIA in the last 168 hours. CBC:  Recent Labs Lab 06/24/15 1720  WBC 10.5  NEUTROABS 9.9*  HGB 14.0  HCT 43.2  MCV 91.9  PLT 190   Cardiac Enzymes:   No results for input(s): CKTOTAL, CKMB, CKMBINDEX, TROPONINI in the last 168 hours. BNP (last 3 results)  Recent Labs  06/24/15 1720  BNP 50.7    ProBNP (last 3 results) No results for input(s): PROBNP in the last 8760 hours.  CBG: No results for input(s): GLUCAP in the last 168 hours.  Recent Results (from the past 240 hour(s))  Culture, expectorated sputum-assessment     Status: None   Collection Time: 06/25/15  1:47 PM  Result Value Ref Range Status   Specimen Description SPUTUM  Final   Special Requests NONE  Final   Sputum evaluation   Final    THIS SPECIMEN IS ACCEPTABLE. RESPIRATORY CULTURE REPORT TO FOLLOW.   Report Status 06/25/2015 FINAL  Final  Culture, respiratory (NON-Expectorated)     Status: None (Preliminary result)   Collection Time: 06/25/15  1:47 PM  Result Value Ref Range Status   Specimen Description SPUTUM  Final   Special Requests NONE  Final   Gram Stain  Final    ABUNDANT WBC PRESENT,BOTH PMN AND MONONUCLEAR FEW SQUAMOUS EPITHELIAL CELLS PRESENT ABUNDANT GRAM POSITIVE COCCI IN PAIRS IN CHAINS Performed at [facility]    Culture PENDING  Incomplete   Report Status PENDING  Incomplete  MRSA PCR Screening     Status: None   Collection Time: 06/25/15 10:50 PM  Result Value Ref Range Status   MRSA by PCR NEGATIVE NEGATIVE Final    Comment:        The GeneXpert MRSA Assay (FDA approved for NASAL specimens only), is one component of a comprehensive MRSA colonization surveillance program. It is not intended to diagnose MRSA infection nor to guide or monitor treatment for MRSA infections.      Studies: No results  found.  Scheduled Meds: . azithromycin  250 mg Oral Daily  . cefTRIAXone (ROCEPHIN)  IV  1 g Intravenous Q24H  . dextromethorphan-guaiFENesin  1 tablet Oral BID  . enoxaparin (LOVENOX) injection  40 mg Subcutaneous Q24H  . famotidine  20 mg Oral BID  . gabapentin  200 mg Oral QHS  . hydrochlorothiazide  25 mg Oral Daily  . ipratropium-albuterol  3 mL Nebulization TID  . methylPREDNISolone (SOLU-MEDROL) injection  60 mg Intravenous Q12H  . PARoxetine  20 mg Oral Daily    Continuous Infusions: . sodium chloride 75 mL/hr at 06/26/15 1140     Time spent: 59mins  Jamaiya Tunnell MD, PhD  [facility] Pager [phone]. If 7PM-7AM, please contact night-coverage at [email or web address], password [facility] 06/26/2015, 6:24 PM  LOS: 2 days

## 2015-06-26 NOTE — Progress Notes (Signed)
Had episode of jaw and upper mid chest pain lasting approx 63mins, ekg done - NSR.Norco given . Dr. Erlinda Hong notified.Susan Frye

## 2015-06-27 LAB — BASIC METABOLIC PANEL
ANION GAP: 6 (ref 5–15)
BUN: 22 mg/dL — ABNORMAL HIGH (ref 6–20)
CALCIUM: 9.2 mg/dL (ref 8.9–10.3)
CO2: 31 mmol/L (ref 22–32)
CREATININE: 0.7 mg/dL (ref 0.44–1.00)
Chloride: 102 mmol/L (ref 101–111)
Glucose, Bld: 136 mg/dL — ABNORMAL HIGH (ref 65–99)
Potassium: 5 mmol/L (ref 3.5–5.1)
SODIUM: 139 mmol/L (ref 135–145)

## 2015-06-27 LAB — TSH: TSH: 0.352 u[IU]/mL (ref 0.350–4.500)

## 2015-06-27 NOTE — Progress Notes (Signed)
PROGRESS NOTE  Susan Frye [id number] DOB: 1945/10/04 DOA: 06/24/2015 PCP: Woody Seller, MD  HPI/Recap of past 24 hours:  Better, sitting in chair , reading newspaper  Assessment/Plan: Principal Problem:   COPD exacerbation ([place]) Active Problems:   Essential hypertension   Chronic venous insufficiency   Acute on chronic respiratory failure with hypoxia (HCC)   Depression   Chronic back pain  copd exacerbation:   diffuse wheezing on admission, reported was treated with abx and steroids, not better,  on rocephin,continue zithro/nebs/steroids 12/4 better, wean oxygen, ambulate  HTN: -stable, d/c HCTZ for now due to report thick mucus, not able to cough it up, start gentle hydration. 12/4 d/c ivf, continue hold hctz.  Chronic back pain: -prn Norco and Neurontin  Depression: Stable, no suicidal or homicidal ideations. -Continue home medications: Paxil  DVT ppx: SQ Lovenox  Code Status: full  Family Communication: patient  Disposition Plan: home early next week, may need home health/oxygen   Consultants:  none  Procedures:  none  Antibiotics:  Rocephin  From 12/2  zithro from admission   Objective: BP 115/61 mmHg  Pulse 72  Temp(Src) 97.8 F (36.6 C) (Oral)  Resp 20  Ht 6' (1.829 m)  Wt 218 lb 4.1 oz (99 kg)  BMI 29.59 kg/m2  SpO2 97%  Intake/Output Summary (Last 24 hours) at 06/27/15 1848 Last data filed at 06/27/15 1750  Gross per 24 hour  Intake   1840 ml  Output   1500 ml  Net    340 ml   Filed Weights   06/24/15 2052  Weight: 218 lb 4.1 oz (99 kg)    Exam:   General:  NAD  Cardiovascular: RRR  Respiratory: wheezing almost resolved, diminished overall.  Abdomen: Soft/ND/NT, positive BS  Musculoskeletal: No Edema  Neuro: aaox3  Data Reviewed: Basic Metabolic Panel:  Recent Labs Lab 06/24/15 1720 06/26/15 0530 06/27/15 0440  NA 137 138 139  K 4.0 5.0 5.0  CL 99* 99* 102  CO2 30 32 31  GLUCOSE 156*  149* 136*  BUN 15 19 22*  CREATININE 0.67 0.76 0.70  CALCIUM 9.7 9.6 9.2  MG  --  2.4  --    Liver Function Tests:  Recent Labs Lab 06/26/15 0530  AST 20  ALT 15  ALKPHOS 65  BILITOT 0.8  PROT 7.0  ALBUMIN 3.4*   No results for input(s): LIPASE, AMYLASE in the last 168 hours. No results for input(s): AMMONIA in the last 168 hours. CBC:  Recent Labs Lab 06/24/15 1720  WBC 10.5  NEUTROABS 9.9*  HGB 14.0  HCT 43.2  MCV 91.9  PLT 190   Cardiac Enzymes:   No results for input(s): CKTOTAL, CKMB, CKMBINDEX, TROPONINI in the last 168 hours. BNP (last 3 results)  Recent Labs  06/24/15 1720  BNP 50.7    ProBNP (last 3 results) No results for input(s): PROBNP in the last 8760 hours.  CBG: No results for input(s): GLUCAP in the last 168 hours.  Recent Results (from the past 240 hour(s))  Culture, expectorated sputum-assessment     Status: None   Collection Time: 06/25/15  1:47 PM  Result Value Ref Range Status   Specimen Description SPUTUM  Final   Special Requests NONE  Final   Sputum evaluation   Final    THIS SPECIMEN IS ACCEPTABLE. RESPIRATORY CULTURE REPORT TO FOLLOW.   Report Status 06/25/2015 FINAL  Final  Culture, respiratory (NON-Expectorated)     Status: None (Preliminary result)  Collection Time: 06/25/15  1:47 PM  Result Value Ref Range Status   Specimen Description SPUTUM  Final   Special Requests NONE  Final   Gram Stain   Final    ABUNDANT WBC PRESENT,BOTH PMN AND MONONUCLEAR FEW SQUAMOUS EPITHELIAL CELLS PRESENT ABUNDANT GRAM POSITIVE COCCI IN PAIRS IN CHAINS Performed at [facility]    Culture   Final    Culture reincubated for better growth Performed at [facility]    Report Status PENDING  Incomplete  MRSA PCR Screening     Status: None   Collection Time: 06/25/15 10:50 PM  Result Value Ref Range Status   MRSA by PCR NEGATIVE NEGATIVE Final    Comment:        The GeneXpert MRSA Assay (FDA approved for NASAL  specimens only), is one component of a comprehensive MRSA colonization surveillance program. It is not intended to diagnose MRSA infection nor to guide or monitor treatment for MRSA infections.      Studies: No results found.  Scheduled Meds: . azithromycin  250 mg Oral Daily  . cefTRIAXone (ROCEPHIN)  IV  1 g Intravenous Q24H  . dextromethorphan-guaiFENesin  1 tablet Oral BID  . enoxaparin (LOVENOX) injection  40 mg Subcutaneous Q24H  . famotidine  20 mg Oral BID  . gabapentin  200 mg Oral QHS  . ipratropium-albuterol  3 mL Nebulization TID  . methylPREDNISolone (SOLU-MEDROL) injection  60 mg Intravenous Q12H  . PARoxetine  20 mg Oral Daily    Continuous Infusions:     Time spent: 17mins  Avyanna Spada MD, PhD  [facility] Pager [phone]. If 7PM-7AM, please contact night-coverage at [email or web address], password [facility] 06/27/2015, 6:48 PM  LOS: 3 days

## 2015-06-28 ENCOUNTER — Inpatient Hospital Stay (HOSPITAL_COMMUNITY): Payer: Medicare Other

## 2015-06-28 DIAGNOSIS — F329 Major depressive disorder, single episode, unspecified: Secondary | ICD-10-CM

## 2015-06-28 DIAGNOSIS — R079 Chest pain, unspecified: Secondary | ICD-10-CM

## 2015-06-28 DIAGNOSIS — M549 Dorsalgia, unspecified: Secondary | ICD-10-CM

## 2015-06-28 DIAGNOSIS — G8929 Other chronic pain: Secondary | ICD-10-CM

## 2015-06-28 LAB — CULTURE, RESPIRATORY W GRAM STAIN

## 2015-06-28 LAB — BASIC METABOLIC PANEL
ANION GAP: 5 (ref 5–15)
BUN: 20 mg/dL (ref 6–20)
CALCIUM: 8.8 mg/dL — AB (ref 8.9–10.3)
CHLORIDE: 102 mmol/L (ref 101–111)
CO2: 31 mmol/L (ref 22–32)
CREATININE: 0.67 mg/dL (ref 0.44–1.00)
GFR calc non Af Amer: >60 mL/min (ref >60)
Glucose, Bld: 126 mg/dL — ABNORMAL HIGH (ref 65–99)
Potassium: 4.6 mmol/L (ref 3.5–5.1)
SODIUM: 138 mmol/L (ref 135–145)

## 2015-06-28 LAB — CULTURE, RESPIRATORY: CULTURE: NORMAL

## 2015-06-28 LAB — HEMOGLOBIN A1C
HEMOGLOBIN A1C: 5.8 % — AB (ref 4.8–5.6)
MEAN PLASMA GLUCOSE: 120 mg/dL

## 2015-06-28 MED ORDER — DOXYCYCLINE HYCLATE 100 MG PO TABS: 100.0000 mg | ORAL_TABLET | Freq: Two times a day (BID) | ORAL | Status: DC

## 2015-06-28 MED ORDER — IPRATROPIUM-ALBUTEROL 0.5-2.5 (3) MG/3ML IN SOLN: 3.0000 mL | Freq: Three times a day (TID) | RESPIRATORY_TRACT | Status: DC

## 2015-06-28 MED ORDER — PREDNISONE 10 MG PO TABS: ORAL_TABLET | ORAL | Status: DC

## 2015-06-28 MED ORDER — PREDNISONE 50 MG PO TABS
50.0000 mg | ORAL_TABLET | Freq: Every day | ORAL | Status: DC
  Administered 2015-06-29: 50 mg via ORAL
  Filled 2015-06-28: qty 1

## 2015-06-28 MED ORDER — DM-GUAIFENESIN ER 30-600 MG PO TB12: 1.0000 | ORAL_TABLET | Freq: Two times a day (BID) | ORAL | Status: DC

## 2015-06-28 MED ORDER — DOXYCYCLINE HYCLATE 100 MG PO TABS
100.0000 mg | ORAL_TABLET | Freq: Two times a day (BID) | ORAL | Status: DC
  Administered 2015-06-28 – 2015-06-29 (×3): 100 mg via ORAL
  Filled 2015-06-28 (×3): qty 1

## 2015-06-28 MED ORDER — SERTRALINE HCL 25 MG PO TABS: 25.0000 mg | ORAL_TABLET | Freq: Every day | ORAL | Status: DC

## 2015-06-28 MED ORDER — PERFLUTREN LIPID MICROSPHERE
1.0000 mL | INTRAVENOUS | Status: AC | PRN
  Administered 2015-06-28: 3 mL via INTRAVENOUS
  Filled 2015-06-28: qty 10

## 2015-06-28 MED ORDER — SERTRALINE HCL 25 MG PO TABS
25.0000 mg | ORAL_TABLET | Freq: Every day | ORAL | Status: DC
  Administered 2015-06-29: 25 mg via ORAL
  Filled 2015-06-28: qty 1

## 2015-06-28 MED ORDER — FAMOTIDINE 20 MG PO TABS: 20.0000 mg | ORAL_TABLET | Freq: Two times a day (BID) | ORAL | Status: DC

## 2015-06-28 NOTE — Progress Notes (Signed)
Occupational Therapy Treatment Patient Details Name: Susan Frye MRN: [id number] DOB: 1946/07/04 Today's Date: 06/28/2015    History of present illness 69 yo female admitted with COPD exac. Hx of HTN, COPD. skin cancer, chronic back pain.    OT comments  Pt seen today for ADL retraining session with focus on energy conservation techniques, need for rest breaks & recommended DME. Pt O2 sats decreased to 88-89% on 2L after amb to bathroom and grooming. She quickly recovered to 90-92% and amb in hallway to RN station (vc's for rest breaks) not going below 90% - RN was made aware.   Follow Up Recommendations  Home health OT    Equipment Recommendations   (Reviewed rec. of  tub bench and energy conservation techniques)    Recommendations for Other Services      Precautions / Restrictions Precautions Precautions: Fall Restrictions Weight Bearing Restrictions: No       Mobility Bed Mobility Overal bed mobility: Modified Independent                Transfers Overall transfer level: Needs assistance   Transfers: Sit to/from Stand;Stand Pivot Transfers Sit to Stand: Supervision         General transfer comment: for safety.     Balance                                   ADL Overall ADL's : Needs assistance/impaired     Grooming: Wash/dry hands;Set up;Standing                       Toileting- Clothing Manipulation and Hygiene: Modified independent;Sit to/from stand;Cueing for safety Toileting - Clothing Manipulation Details (indicate cue type and reason): Min VC's for rest break as pt O2 sats decreased to 88-89% on 2L nasal cannula. Pt quickly back up to 92% after ~30 second rest break. Tub/ Shower Transfer:  (Reviewed use of tub bench verbally and tech. for safety as well as hand held shower for [facility].)     General ADL Comments: Pt completes ADL's w/ set up - issued & reviewed handout for Energy Conservation techniques today and discussed  frequent rest breaks and pursed lip breathing during functional transfers. Pt ambulated in hall after ADL's on 2L O2 via nasal cannula was was given min vc's for rest break ans maintained O2 of 90-92%. Pt reports that she is fearful of "maybe having to go home with oxygen" to use during the day (currently only uses at night).      Vision  Wears glasses; No change from baseline.                   Perception     Praxis      Cognition   Behavior During Therapy: WFL for tasks assessed/performed Overall Cognitive Status: Within Functional Limits for tasks assessed                       Extremity/Trunk Assessment               Exercises     Shoulder Instructions       General Comments      Pertinent Vitals/ Pain       Pain Assessment: No/denies pain  Home Living  Prior Functioning/Environment              Frequency Min 2X/week     Progress Toward Goals  OT Goals(current goals can now be found in the care plan section)  Progress towards OT goals: Progressing toward goals  Acute Rehab OT Goals Patient Stated Goal: get back to baseline  Plan Discharge plan remains appropriate    Co-evaluation                 End of Session Equipment Utilized During Treatment: Gait belt;Oxygen   Activity Tolerance Patient tolerated treatment well   Patient Left in chair;with call bell/phone within reach;with chair alarm set;with family/visitor present   Nurse Communication Other (comment) (O2 and sats during functional mobility.)        Time: [id number] OT Time Calculation (min): 21 min  Charges: OT General Charges $OT Visit: 1 Procedure OT Treatments $Self Care/Home Management : 8-22 mins  Orissa Arreaga Beth Dixon, OTR/L 06/28/2015, 11:55 AM

## 2015-06-28 NOTE — Progress Notes (Addendum)
PROGRESS NOTE  Susan Frye [id number] DOB: 1946/02/14 DOA: 06/24/2015 PCP: Woody Seller, MD  HPI/Recap of past 24 hours:  Walked with physical therapy this am, reported feeling depressed,  Assessment/Plan: Principal Problem:   COPD exacerbation ([place]) Active Problems:   Essential hypertension   Chronic venous insufficiency   Acute on chronic respiratory failure with hypoxia (HCC)   Depression   Chronic back pain  copd exacerbation:   diffuse wheezing on admission, reported was treated with abx and steroids, not better,  on rocephin,continue zithro/nebs/steroids better, wean oxygen, ambulate, wean steroids. Change meds to oral  HTN: -stable, d/c HCTZ for now due to report thick mucus, not able to cough it up, start gentle hydration. 12/4 d/c ivf, continue hold hctz. Will resume HCTZ at discharge.  SVT: few run of SVt on tele, echo pending, tsh wnl,  K/mag wnl. consider betablocker?  Chronic back pain/spinal stenosis: -prn Norco and Neurontin, reported use a walker prn at home  Depression:  Stable, no suicidal or homicidal ideations.  -reported Paxil not helping -change to zoloft, recommend psychiatry referral by PMD.  DVT ppx: SQ Lovenox  Code Status: full  Family Communication: patient  Disposition Plan: home 12/6 with home health/oxygen   Consultants:  none  Procedures:  none  Antibiotics:  Rocephin  From 12/2 to 12/5  zithro from admission  Doxycycline from 12/5   Objective: BP 123/57 mmHg  Pulse 67  Temp(Src) 97.9 F (36.6 C) (Oral)  Resp 20  Ht 6' (1.829 m)  Wt 223 lb 5.2 oz (101.3 kg)  BMI 30.28 kg/m2  SpO2 91%  Intake/Output Summary (Last 24 hours) at 06/28/15 1309 Last data filed at 06/28/15 0459  Gross per 24 hour  Intake   1740 ml  Output   1450 ml  Net    290 ml   Filed Weights   06/24/15 2052 06/28/15 0504  Weight: 218 lb 4.1 oz (99 kg) 223 lb 5.2 oz (101.3 kg)    Exam:   General:  NAD  Cardiovascular:  RRR  Respiratory: wheezing almost resolved, improved areation.  Abdomen: Soft/ND/NT, positive BS  Musculoskeletal: No Edema  Neuro: aaox3  Data Reviewed: Basic Metabolic Panel:  Recent Labs Lab 06/24/15 1720 06/26/15 0530 06/27/15 0440 06/28/15 0445  NA 137 138 139 138  K 4.0 5.0 5.0 4.6  CL 99* 99* 102 102  CO2 30 32 31 31  GLUCOSE 156* 149* 136* 126*  BUN 15 19 22* 20  CREATININE 0.67 0.76 0.70 0.67  CALCIUM 9.7 9.6 9.2 8.8*  MG  --  2.4  --   --    Liver Function Tests:  Recent Labs Lab 06/26/15 0530  AST 20  ALT 15  ALKPHOS 65  BILITOT 0.8  PROT 7.0  ALBUMIN 3.4*   No results for input(s): LIPASE, AMYLASE in the last 168 hours. No results for input(s): AMMONIA in the last 168 hours. CBC:  Recent Labs Lab 06/24/15 1720  WBC 10.5  NEUTROABS 9.9*  HGB 14.0  HCT 43.2  MCV 91.9  PLT 190   Cardiac Enzymes:   No results for input(s): CKTOTAL, CKMB, CKMBINDEX, TROPONINI in the last 168 hours. BNP (last 3 results)  Recent Labs  06/24/15 1720  BNP 50.7    ProBNP (last 3 results) No results for input(s): PROBNP in the last 8760 hours.  CBG: No results for input(s): GLUCAP in the last 168 hours.  Recent Results (from the past 240 hour(s))  Culture, expectorated sputum-assessment  Status: None   Collection Time: 06/25/15  1:47 PM  Result Value Ref Range Status   Specimen Description SPUTUM  Final   Special Requests NONE  Final   Sputum evaluation   Final    THIS SPECIMEN IS ACCEPTABLE. RESPIRATORY CULTURE REPORT TO FOLLOW.   Report Status 06/25/2015 FINAL  Final  Culture, respiratory (NON-Expectorated)     Status: None   Collection Time: 06/25/15  1:47 PM  Result Value Ref Range Status   Specimen Description SPUTUM  Final   Special Requests NONE  Final   Gram Stain   Final    ABUNDANT WBC PRESENT,BOTH PMN AND MONONUCLEAR FEW SQUAMOUS EPITHELIAL CELLS PRESENT ABUNDANT GRAM POSITIVE COCCI IN PAIRS IN CHAINS Performed at [facility]    Culture   Final    NORMAL OROPHARYNGEAL FLORA Performed at [facility]    Report Status 06/28/2015 FINAL  Final  MRSA PCR Screening     Status: None   Collection Time: 06/25/15 10:50 PM  Result Value Ref Range Status   MRSA by PCR NEGATIVE NEGATIVE Final    Comment:        The GeneXpert MRSA Assay (FDA approved for NASAL specimens only), is one component of a comprehensive MRSA colonization surveillance program. It is not intended to diagnose MRSA infection nor to guide or monitor treatment for MRSA infections.      Studies: No results found.  Scheduled Meds: . dextromethorphan-guaiFENesin  1 tablet Oral BID  . doxycycline  100 mg Oral Q12H  . enoxaparin (LOVENOX) injection  40 mg Subcutaneous Q24H  . famotidine  20 mg Oral BID  . gabapentin  200 mg Oral QHS  . ipratropium-albuterol  3 mL Nebulization TID  . [START ON 06/29/2015] predniSONE  50 mg Oral Q breakfast  . [START ON 06/29/2015] sertraline  25 mg Oral Daily    Continuous Infusions:     Time spent: 64mins  Annison Birchard MD, PhD  [facility] Pager [phone]. If 7PM-7AM, please contact night-coverage at [email or web address], password [facility] 06/28/2015, 1:09 PM  LOS: 4 days

## 2015-06-28 NOTE — Telephone Encounter (Signed)
Called spoke with Margreta Journey and made aware. Nothing further needed

## 2015-06-28 NOTE — Care Management Important Message (Signed)
Important Message  Patient Details  Name: Susan Frye MRN: [id number] Date of Birth: [birth year]   Medicare Important Message Given:  Yes    Shelda Altes 06/28/2015, 11:06 AMImportant Message  Patient Details  Name: Susan Frye MRN: [id number] Date of Birth: Dec 14, 1945   Medicare Important Message Given:  Yes    Shelda Altes 06/28/2015, 11:06 AM

## 2015-06-28 NOTE — Telephone Encounter (Signed)
Dr. Elsworth Soho, please advise on below.Marland Kitchen

## 2015-06-28 NOTE — Telephone Encounter (Signed)
Possible, but in general mold infections occur only in immunocompromised patients Can discuss on follow-up visit after discharge Should be safe to go back home if house has been professionally cleaned

## 2015-06-28 NOTE — Care Management Note (Signed)
Case Management Note  Patient Details  Name: REHANA ORRICK MRN: [id number] Date of Birth: 1945/07/31  Subjective/Objective:             69 yo admitted with COPD       Action/Plan: From home alone and uses 02 at night.  Expected Discharge Date:   (unknown)               Expected Discharge Plan:  [place]  In-House Referral:     Discharge planning Services  CM Consult  Post Acute Care Choice:  Home Health Choice offered to:     DME Arranged:  Oxygen (needs 24hrs a day now vs just at night) DME Agency:  [place]:  RN, PT, OT [facility] Agency:  [place]  Status of Service:  In process, will continue to follow  Medicare Important Message Given:  Yes Date Medicare IM Given:    Medicare IM give by:    Date Additional Medicare IM Given:    Additional Medicare Important Message give by:     If discussed at [place] of Stay Meetings, dates discussed:    Additional Comments: PT/OT recommendations are for HHPT/OT. Pt receives home 02 at night currently. Desaturation screen done by PT which shows pt is eligible for home 02 all the time. Pt currently receives 02 from [facility]. [place] DME rep informed of pt increased need for 02. Will need MD order for continuous 02 at home. Pt offered choice for HHPT/OT/RN. Pt chooses [facility]. [facility] HH rep alerted of referral. No other DC needs communicated at this time. CM will continue to follow.  Lynnell Catalan, RN 06/28/2015, 2:09 PM

## 2015-06-28 NOTE — Progress Notes (Signed)
Physical Therapy Treatment Patient Details Name: Susan Frye MRN: [id number] DOB: 1946-01-16 Today's Date: 06/28/2015    History of Present Illness 69 yo female admitted with COPD exac. Hx of HTN, COPD. skin cancer, chronic back pain.     PT Comments    Pt progressing well with mobility, she walked 350' with RW independently. SaO2 86% on RA walking, 90-93% on 2L O2 [place] walking. 2/4 dyspnea.  Follow Up Recommendations  Home health PT     Equipment Recommendations  None recommended by PT    Recommendations for Other Services OT consult     Precautions / Restrictions Precautions Precautions: Fall Precaution Comments: pt denies falls in past year Restrictions Weight Bearing Restrictions: No    Mobility  Bed Mobility Overal bed mobility: Modified Independent             General bed mobility comments: NT- up in recliner  Transfers Overall transfer level: Modified independent Equipment used: None Transfers: Sit to/from Stand Sit to Stand: Modified independent (Device/Increase time)         General transfer comment: pushed up using armrests  Ambulation/Gait Ambulation/Gait assistance: Modified independent Ambulation Distance (Feet): 350 Feet Assistive device: Rolling walker (2 wheeled) Gait Pattern/deviations: Trunk flexed;Step-through pattern     General Gait Details: SaO2 86% on RA walking, 90-93% on 2L O2 [place] with walking, HR 113, no LOB   Stairs            Wheelchair Mobility    Modified Rankin (Stroke Patients Only)       Balance Overall balance assessment: Modified Independent             Standing balance comment: relies on RW in standing due to chronic LBP                    Cognition Arousal/Alertness: Awake/alert Behavior During Therapy: WFL for tasks assessed/performed Overall Cognitive Status: Within Functional Limits for tasks assessed                      Exercises      General Comments         Pertinent Vitals/Pain Pain Assessment: No/denies pain    Home Living                      Prior Function            PT Goals (current goals can now be found in the care plan section) Acute Rehab PT Goals Patient Stated Goal: get back to baseline PT Goal Formulation: With patient Time For Goal Achievement: 07/09/15 Potential to Achieve Goals: Good Progress towards PT goals: Progressing toward goals    Frequency  Min 3X/week    PT Plan Current plan remains appropriate    Co-evaluation             End of Session Equipment Utilized During Treatment: Gait belt;Oxygen Activity Tolerance: Patient tolerated treatment well Patient left: in chair;with call bell/phone within reach;with chair alarm set     Time: [id number] PT Time Calculation (min) (ACUTE ONLY): 18 min  Charges:  $Gait Training: 8-22 mins                    G Codes:      Philomena Doheny 06/28/2015, 12:44 PM [phone]

## 2015-06-28 NOTE — Progress Notes (Signed)
Echocardiogram 2D Echocardiogram with Definity has been performed.  Susan Frye 06/28/2015, 5:05 PM

## 2015-06-28 NOTE — Progress Notes (Signed)
SATURATION QUALIFICATIONS: (This note is used to comply with regulatory documentation for home oxygen)  Patient Saturations on Room Air at Rest = 87%  Patient Saturations on Room Air while Ambulating = 86%  Patient Saturations on 2 Liters of oxygen while Ambulating = 91%  Please briefly explain why patient needs home oxygen: to maintain appropriate SaO2 levels. Blondell Reveal Kistler PT 06/28/2015  [phone]

## 2015-06-29 LAB — BASIC METABOLIC PANEL
ANION GAP: 5 (ref 5–15)
BUN: 20 mg/dL (ref 6–20)
CHLORIDE: 102 mmol/L (ref 101–111)
CO2: 33 mmol/L — ABNORMAL HIGH (ref 22–32)
Calcium: 8.8 mg/dL — ABNORMAL LOW (ref 8.9–10.3)
Creatinine, Ser: 0.74 mg/dL (ref 0.44–1.00)
Glucose, Bld: 93 mg/dL (ref 65–99)
POTASSIUM: 3.9 mmol/L (ref 3.5–5.1)
SODIUM: 140 mmol/L (ref 135–145)

## 2015-06-29 LAB — MAGNESIUM: MAGNESIUM: 2.3 mg/dL (ref 1.7–2.4)

## 2015-06-29 MED ORDER — HYDROCODONE-ACETAMINOPHEN 10-325 MG PO TABS: 1.0000 | ORAL_TABLET | Freq: Four times a day (QID) | ORAL | Status: DC | PRN

## 2015-06-29 MED ORDER — AMOXICILLIN-POT CLAVULANATE 875-125 MG PO TABS: 1.0000 | ORAL_TABLET | Freq: Two times a day (BID) | ORAL | Status: DC

## 2015-06-29 MED ORDER — FLORANEX PO PACK
1.0000 g | PACK | Freq: Three times a day (TID) | ORAL | Status: DC
Start: 2015-06-29 — End: 2015-07-30

## 2015-06-29 MED ORDER — IPRATROPIUM-ALBUTEROL 0.5-2.5 (3) MG/3ML IN SOLN: 3.0000 mL | Freq: Three times a day (TID) | RESPIRATORY_TRACT | Status: AC

## 2015-06-29 NOTE — Discharge Summary (Addendum)
Discharge Summary  Susan Frye [id number] DOB: 08/14/1945  PCP: Woody Seller, MD  Admit date: 06/24/2015 Discharge date: 06/29/2015  Time spent: <75mins  Recommendations for Outpatient Follow-up:  1. F/u with PMD within a week for hospital discharge follow up, repeat cbc/bmp at follow up. 2. F/u with pulmonology Dr. Elsworth Soho in two weeks for copd/home oxygen management 3. Home health PT/OT/RN/home oxygen arranged by care manager  Discharge Diagnoses:  Active Hospital Problems   Diagnosis Date Noted  . COPD exacerbation ([place]) 07/12/2009  . Acute on chronic respiratory failure with hypoxia ([place]) 06/24/2015  . Chronic back pain 06/24/2015  . Depression   . Chronic venous insufficiency 06/05/2014  . Essential hypertension 07/12/2009    Resolved Hospital Problems   Diagnosis Date Noted Date Resolved  No resolved problems to display.    Discharge Condition: stable  Diet recommendation: heart healthy  Filed Weights   06/24/15 2052 06/28/15 0504  Weight: 218 lb 4.1 oz (99 kg) 223 lb 5.2 oz (101.3 kg)    History of present illness:  Susan Frye is a 69 y.o. female with PMH of former smoker, hypertension, COPD, depression, skin cancer, chronic back pain, who presents with productive cough and shortness of breath.  Patient reports that she has been having worsening shortness of breath for more than 10 days. She also has productive cough with brownish colored sputum production. She does not have fever, chills, chest pain. No tenderness over calf areas. She reports that she was treated by her PCP with doxycycline and prednisone last week, without significant improvement. She still has severe shortness of breath and productive cough. Patient does not have abdominal pain, diarrhea, symptoms of UTI unilateral weakness. She states that she has chronic lower back pain which has not changed.  In ED, patient was found to have oxygen desaturation to 77% on room air, WBC 10.5,  temperature normal, slightly tachycardia, negative troponin, electrolytes and renal function okay. Chest x-ray has no infiltration. D-dimer 0.56.  Hospital Course:  Principal Problem:   COPD exacerbation ([place]) Active Problems:   Essential hypertension   Chronic venous insufficiency   Acute on chronic respiratory failure with hypoxia (HCC)   Depression   Chronic back pain  copd exacerbation:  diffuse wheezing on admission, reported was treated with doxycycline and steroids without relieve.  Admitted to med /tele and received rocephin/ zithro/nebs/steroids Wheezing resolved at discharge,  oxygen saturation at 86% while ambulating on room air, she is discharged on home oxygen 24/7 with 2liters ( she was previously on nightly oxygen supplement) abx changed to oral doxycycline then changed to augmentin due to patient report that she was on doxycycline prior to admission , she did not get better on it.  taper steroids, discharged with duoneb (patient has nebulizer at home), augmentin for finish abx course. Pulmonology follow up.  Acute on chronic hypoxic respiratory failure: o2 sats on admission was 77% on room air. Improved, discharged with home oxygen 24/7.  HTN: -stable, d/c HCTZ for now due to report thick mucus, not able to cough it up, start gentle hydration. 12/4 d/c ivf, continue hold hctz. resume HCTZ at discharge.  SVT: 3 run of SVT (less than 15 beats) on tele on 12/3 , 12/4 and 12/5, asymptomatic, echo unremarkable, tsh wnl, K/mag wnl.  May consider low dose betablocker  Or calcium channel blocker if symptomatic, patient is to follow up with pmd.  Chronic back pain/spinal stenosis: -prn Norco and Neurontin, reported use a walker prn at home  Depression:  Stable, no suicidal or homicidal ideations.  -reported Paxil not helping -change to zoloft, recommend psychiatry referral by PMD.   Code Status: full  Family Communication: patient  Disposition Plan: home 12/6  with home health/oxygen   Consultants:  none  Procedures:  none  Antibiotics:  Rocephin From 12/2 to 12/5  zithro from admission  Doxycycline x1 on12/5  Discharged with augmentin  Discharge Exam: BP 157/73 mmHg  Pulse 74  Temp(Src) 98.7 F (37.1 C) (Oral)  Resp 20  Ht 6' (1.829 m)  Wt 223 lb 5.2 oz (101.3 kg)  BMI 30.28 kg/m2  SpO2 97%   General: NAD  Cardiovascular: RRR  Respiratory: wheezing almost resolved, improved areation.  Abdomen: Soft/ND/NT, positive BS  Musculoskeletal: No Edema  Neuro: aaox3   Discharge Instructions You were cared for by a hospitalist during your hospital stay. If you have any questions about your discharge medications or the care you received while you were in the hospital after you are discharged, you can call the unit and asked to speak with the hospitalist on call if the hospitalist that took care of you is not available. Once you are discharged, your primary care physician will handle any further medical issues. Please note that NO REFILLS for any discharge medications will be authorized once you are discharged, as it is imperative that you return to your primary care physician (or establish a relationship with a primary care physician if you do not have one) for your aftercare needs so that they can reassess your need for medications and monitor your lab values.      Discharge Instructions    Diet - low sodium heart healthy    Complete by:  As directed      Face-to-face encounter (required for Medicare/Medicaid patients)    Complete by:  As directed   I Raylen Tangonan certify that this patient is under my care and that I, or a nurse practitioner or physician's assistant working with me, had a face-to-face encounter that meets the physician face-to-face encounter requirements with this patient on 06/28/2015. The encounter with the patient was in whole, or in part for the following medical condition(s) which is the primary reason for  home health care (List medical condition): FTT  The encounter with the patient was in whole, or in part, for the following medical condition, which is the primary reason for home health care:  FTT  I certify that, based on my findings, the following services are medically necessary home health services:  Physical therapy  Reason for Medically Necessary Home Health Services:  Skilled Nursing- Change/Decline in Patient Status  My clinical findings support the need for the above services:  Shortness of breath with activity  Further, I certify that my clinical findings support that this patient is homebound due to:  Shortness of Breath with activity     For home use only DME oxygen    Complete by:  As directed   Mode or (Route):  Nasal cannula  Liters per Minute:  2  Frequency:  Continuous (stationary and portable oxygen unit needed)  Oxygen delivery system:  Gas     Home Health    Complete by:  As directed   To provide the following care/treatments:   PT OT RN       Increase activity slowly    Complete by:  As directed             Medication List    STOP taking these medications  OXYGEN     PARoxetine 20 MG tablet  Commonly known as:  PAXIL      TAKE these medications        albuterol 108 (90 BASE) MCG/ACT inhaler  Commonly known as:  PROVENTIL HFA;VENTOLIN HFA  Inhale 2 puffs into the lungs every 6 (six) hours as needed for wheezing or shortness of breath.     amoxicillin-clavulanate 875-125 MG tablet  Commonly known as:  AUGMENTIN  Take 1 tablet by mouth 2 (two) times daily.     dextromethorphan-guaiFENesin 30-600 MG 12hr tablet  Commonly known as:  MUCINEX DM  Take 1 tablet by mouth 2 (two) times daily.     famotidine 20 MG tablet  Commonly known as:  PEPCID  Take 1 tablet (20 mg total) by mouth 2 (two) times daily.     gabapentin 100 MG capsule  Commonly known as:  NEURONTIN  Take 200 mg by mouth at bedtime.     hydrochlorothiazide 12.5 MG capsule    Commonly known as:  MICROZIDE  Take 25 mg by mouth daily.     HYDROcodone-acetaminophen 10-325 MG tablet  Commonly known as:  NORCO  Take 1 tablet by mouth every 6 (six) hours as needed.     ipratropium-albuterol 0.5-2.5 (3) MG/3ML Soln  Commonly known as:  DUONEB  Take 3 mLs by nebulization 3 (three) times daily.     lactobacillus Pack  Take 1 packet (1 g total) by mouth 3 (three) times daily with meals.     predniSONE 10 MG tablet  Commonly known as:  DELTASONE  Label  & dispense according to the schedule below:  4 Pills PO on day one, 3 Pills PO on day 2, 2 Pills PO on day3,, 1 Pill PO on day 4, then stop.     sertraline 25 MG tablet  Commonly known as:  ZOLOFT  Take 1 tablet (25 mg total) by mouth daily.     SPIRIVA HANDIHALER 18 MCG inhalation capsule  Generic drug:  tiotropium  INHALE ONE DOSE EVERY DAY       Allergies  Allergen Reactions  . Ace Inhibitors     Cough   Follow-up Information    Follow up with Woody Seller, MD In 1 week.   Specialty:  Family Medicine   Why:  hospital discharge follow up,    Contact information:   [address] [place] [address] [phone]       Follow up with [place].   Contact information:   [address] [place] [address] [phone]       Follow up with Rigoberto Noel., MD In 2 weeks.   Specialty:  Pulmonary Disease   Why:  copd, hospital discharge follow up   Contact information:   [address] [address] [phone]        The results of significant diagnostics from this hospitalization (including imaging, microbiology, ancillary and laboratory) are listed below for reference.    Significant Diagnostic Studies: Dg Chest 2 View  06/24/2015  CLINICAL DATA:  Cough, shortness of breath and congestion for 1 week EXAM: CHEST  2 VIEW COMPARISON:  12/25/2013 FINDINGS: The heart is normal in size and stable. There is tortuosity and calcification of the  thoracic aorta. The pulmonary hila appear normal. There are emphysematous changes and pulmonary scarring but no infiltrates or effusions. Stable calcified granuloma in the right middle lobe. IMPRESSION: Chronic lung changes but no acute pulmonary findings.  Electronically Signed   By: Marijo Sanes M.D.   On: 06/24/2015 17:33    Microbiology: Recent Results (from the past 240 hour(s))  Culture, expectorated sputum-assessment     Status: None   Collection Time: 06/25/15  1:47 PM  Result Value Ref Range Status   Specimen Description SPUTUM  Final   Special Requests NONE  Final   Sputum evaluation   Final    THIS SPECIMEN IS ACCEPTABLE. RESPIRATORY CULTURE REPORT TO FOLLOW.   Report Status 06/25/2015 FINAL  Final  Culture, respiratory (NON-Expectorated)     Status: None   Collection Time: 06/25/15  1:47 PM  Result Value Ref Range Status   Specimen Description SPUTUM  Final   Special Requests NONE  Final   Gram Stain   Final    ABUNDANT WBC PRESENT,BOTH PMN AND MONONUCLEAR FEW SQUAMOUS EPITHELIAL CELLS PRESENT ABUNDANT GRAM POSITIVE COCCI IN PAIRS IN CHAINS Performed at [facility]    Culture   Final    NORMAL OROPHARYNGEAL FLORA Performed at [facility]    Report Status 06/28/2015 FINAL  Final  MRSA PCR Screening     Status: None   Collection Time: 06/25/15 10:50 PM  Result Value Ref Range Status   MRSA by PCR NEGATIVE NEGATIVE Final    Comment:        The GeneXpert MRSA Assay (FDA approved for NASAL specimens only), is one component of a comprehensive MRSA colonization surveillance program. It is not intended to diagnose MRSA infection nor to guide or monitor treatment for MRSA infections.      Labs: Basic Metabolic Panel:  Recent Labs Lab 06/24/15 1720 06/26/15 0530 06/27/15 0440 06/28/15 0445 06/29/15 0533  NA 137 138 139 138 140  K 4.0 5.0 5.0 4.6 3.9  CL 99* 99* 102 102 102  CO2 30 32 31 31 33*  GLUCOSE 156* 149* 136* 126* 93  BUN 15 19  22* 20 20  CREATININE 0.67 0.76 0.70 0.67 0.74  CALCIUM 9.7 9.6 9.2 8.8* 8.8*  MG  --  2.4  --   --  2.3   Liver Function Tests:  Recent Labs Lab 06/26/15 0530  AST 20  ALT 15  ALKPHOS 65  BILITOT 0.8  PROT 7.0  ALBUMIN 3.4*   No results for input(s): LIPASE, AMYLASE in the last 168 hours. No results for input(s): AMMONIA in the last 168 hours. CBC:  Recent Labs Lab 06/24/15 1720  WBC 10.5  NEUTROABS 9.9*  HGB 14.0  HCT 43.2  MCV 91.9  PLT 190   Cardiac Enzymes: No results for input(s): CKTOTAL, CKMB, CKMBINDEX, TROPONINI in the last 168 hours. BNP: BNP (last 3 results)  Recent Labs  06/24/15 1720  BNP 50.7    ProBNP (last 3 results) No results for input(s): PROBNP in the last 8760 hours.  CBG: No results for input(s): GLUCAP in the last 168 hours.     SignedFlorencia Reasons MD, PhD  [facility] 06/29/2015, 10:41 AM

## 2015-06-29 NOTE — Progress Notes (Signed)
Patient discharged home with [facility]. Discharge paperwork explained to patient & patient verbalized understanding. Paper prescriptions given to patient. Home portable O2 tank delivered and sent with patient- 2L/[place] on as ordered. VS stable. Pain under control. Respirations even & unlabored, no S/S of distress. All belongings sent with patient. Home medications retrieved from pharmacy and sent with patient. Patient assisted to family's vehicle by W/C and assistance from nursing staff.

## 2015-06-29 NOTE — Progress Notes (Signed)
Report received from J. [place], RN. No change from initial pm assessment. Will continue to monitor and follow the POC. 

## 2015-06-30 ENCOUNTER — Ambulatory Visit: Payer: Self-pay

## 2015-07-29 ENCOUNTER — Telehealth: Payer: Self-pay | Admitting: Pulmonary Disease

## 2015-07-29 NOTE — Telephone Encounter (Signed)
Spoke with pt. States that she has been having lots of SOB for a few weeks. Has been using 2L of oxygen with minimal relief. Denies chest tightness, wheezing or coughing. She has been scheduled to see MW tomorrow at 12pm. Nothing further was needed.

## 2015-07-30 ENCOUNTER — Ambulatory Visit (INDEPENDENT_AMBULATORY_CARE_PROVIDER_SITE_OTHER): Payer: Medicare Other | Admitting: Internal Medicine

## 2015-07-30 ENCOUNTER — Encounter: Payer: Self-pay | Admitting: Internal Medicine

## 2015-07-30 ENCOUNTER — Other Ambulatory Visit (INDEPENDENT_AMBULATORY_CARE_PROVIDER_SITE_OTHER): Payer: Medicare Other

## 2015-07-30 VITALS — BP 112/62 | HR 70 | Ht 72.0 in | Wt 230.0 lb

## 2015-07-30 DIAGNOSIS — R0600 Dyspnea, unspecified: Secondary | ICD-10-CM

## 2015-07-30 DIAGNOSIS — J9611 Chronic respiratory failure with hypoxia: Secondary | ICD-10-CM

## 2015-07-30 DIAGNOSIS — J449 Chronic obstructive pulmonary disease, unspecified: Secondary | ICD-10-CM | POA: Diagnosis not present

## 2015-07-30 LAB — CBC WITH DIFFERENTIAL/PLATELET
BASOS ABS: 0.1 10*3/uL (ref 0.0–0.1)
Basophils Relative: 0.7 % (ref 0.0–3.0)
EOS ABS: 0.1 10*3/uL (ref 0.0–0.7)
Eosinophils Relative: 1.4 % (ref 0.0–5.0)
HCT: 42.4 % (ref 36.0–46.0)
Hemoglobin: 14.1 g/dL (ref 12.0–15.0)
LYMPHS ABS: 1.9 10*3/uL (ref 0.7–4.0)
Lymphocytes Relative: 25.7 % (ref 12.0–46.0)
MCHC: 33.1 g/dL (ref 30.0–36.0)
MCV: 89.7 fl (ref 78.0–100.0)
MONOS PCT: 9.8 % (ref 3.0–12.0)
Monocytes Absolute: 0.7 10*3/uL (ref 0.1–1.0)
NEUTROS PCT: 62.4 % (ref 43.0–77.0)
Neutro Abs: 4.5 10*3/uL (ref 1.4–7.7)
Platelets: 286 10*3/uL (ref 150.0–400.0)
RBC: 4.73 Mil/uL (ref 3.87–5.11)
RDW: 14.4 % (ref 11.5–15.5)
WBC: 7.2 10*3/uL (ref 4.0–10.5)

## 2015-07-30 LAB — BRAIN NATRIURETIC PEPTIDE: PRO B NATRI PEPTIDE: 26 pg/mL (ref 0.0–100.0)

## 2015-07-30 LAB — BASIC METABOLIC PANEL
BUN: 16 mg/dL (ref 6–23)
CO2: 29 mEq/L (ref 19–32)
Calcium: 9.7 mg/dL (ref 8.4–10.5)
Chloride: 98 mEq/L (ref 96–112)
Creatinine, Ser: 0.72 mg/dL (ref 0.40–1.20)
GFR: 84.86 mL/min (ref >60.00)
Glucose, Bld: 108 mg/dL — ABNORMAL HIGH (ref 70–99)
POTASSIUM: 3.4 meq/L — AB (ref 3.5–5.1)
SODIUM: 137 meq/L (ref 135–145)

## 2015-07-30 MED ORDER — UMECLIDINIUM-VILANTEROL 62.5-25 MCG/INH IN AEPB
INHALATION_SPRAY | RESPIRATORY_TRACT | Status: DC
Start: 2015-07-30 — End: 2015-08-16

## 2015-07-30 NOTE — Progress Notes (Signed)
Subjective:     Patient ID: Susan Frye, female   DOB: 1945/11/22, 70 y.o.   MRN: [id number]       Brief patient profile:  69 yowf quit smokng 03/2004 with slow downhill doe with minimal variability baseline = across parking lot without stopping to point where needs hc parking associated with about 35 lbs of wt gain and documented GOLD III copd    History of Present Illness  July 13, 2009  Cough. Pt last seen in November 2007. Pt c/o cough x 1 month. Cough is prod with clear sputum and worsens at night. She feels like she has some PND. She states that she still has SOB with exertion- same as when seen in 2007. worse after several hours sleeping, occ overt hb, not using ppi consistently. no purulent secretions.  rec   Acid reflux diet   Take prednisone for 6 days    Start Symbicort 2 puffs first thing in am and 2 puffs again in pm about 12 hours later    Return next available lung function    05/02/2011 f/u ov/Emme Rosenau cc doe x shopping at [employer] leaning on cart x years, indolent,  Slowly progressive, temporarily improves with combivent. Minimal cough. rec spiriva one capsule each am first thing in am Only use your combivent  as a rescue medication    Admit date: 06/24/2015 Discharge date: 06/29/2015   Discharge Diagnoses:  Active Hospital Problems   Diagnosis Date Noted  . COPD exacerbation ([place]) 07/12/2009  . Acute on chronic respiratory failure with hypoxia ([place]) 06/24/2015  . Chronic back pain 06/24/2015  . Depression   . Chronic venous insufficiency 06/05/2014  . Essential hypertension 07/12/2009    Resolved Hospital Problems   Diagnosis Date Noted Date Resolved  No resolved problems to display.    Discharge Condition: stable  Diet recommendation: heart healthy  Filed Weights   06/24/15 2052 06/28/15 0504  Weight: 218 lb 4.1 oz (99 kg) 223 lb 5.2 oz (101.3 kg)    History of present illness:  Susan Frye is a  70 y.o. female with PMH of former smoker, hypertension, COPD, depression, skin cancer, chronic back pain, who presents with productive cough and shortness of breath.  Patient reports that she has been having worsening shortness of breath for more than 10 days. She also has productive cough with brownish colored sputum production. She does not have fever, chills, chest pain. No tenderness over calf areas. She reports that she was treated by her PCP with doxycycline and prednisone last week, without significant improvement. She still has severe shortness of breath and productive cough. Patient does not have abdominal pain, diarrhea, symptoms of UTI unilateral weakness. She states that she has chronic lower back pain which has not changed.  In ED, patient was found to have oxygen desaturation to 77% on room air, WBC 10.5, temperature normal, slightly tachycardia, negative troponin, electrolytes and renal function okay. Chest x-ray has no infiltration. D-dimer 0.56.  Hospital Course:  Principal Problem:  COPD exacerbation ([place]) Active Problems:  Essential hypertension  Chronic venous insufficiency  Acute on chronic respiratory failure with hypoxia ([facility])  Depression  Chronic back pain  copd exacerbation:  diffuse wheezing on admission, reported was treated with doxycycline and steroids without relieve.  Admitted to med /tele and received rocephin/ zithro/nebs/steroids Wheezing resolved at discharge,  oxygen saturation at 86% while ambulating on room air, she is discharged on home oxygen 24/7 with 2liters ( she was previously on nightly  oxygen supplement) abx changed to oral doxycycline then changed to augmentin due to patient report that she was on doxycycline prior to admission , she did not get better on it. taper steroids, discharged with duoneb (patient has nebulizer at home), augmentin for finish abx course. Pulmonology follow up.  Acute on chronic hypoxic respiratory failure: o2  sats on admission was 77% on room air. Improved, discharged with home oxygen 24/7.  HTN: -stable, d/c HCTZ for now due to report thick mucus, not able to cough it up, start gentle hydration. 12/4 d/c ivf, continue hold hctz. resume HCTZ at discharge.  SVT: 3 run of SVT (less than 15 beats) on tele on 12/3 , 12/4 and 12/5, asymptomatic, echo unremarkable, tsh wnl, K/mag wnl.  May consider low dose betablocker Or calcium channel blocker if symptomatic, patient is to follow up with pmd.  Chronic back pain/spinal stenosis: -prn Norco and Neurontin, reported use a walker prn at home  Depression:  Stable, no suicidal or homicidal ideations.  -reported Paxil not helping -change to zoloft, recommend psychiatry referral by PMD.        07/30/2015  Post hosp f/u / extended transition of care ov/Dyllin Gulley re: GOLD III / 02 2lpm hs only maint rx = spiriva  Chief Complaint  Patient presents with  . Acute Visit    Reports increased SOB for a few weeks. Also having some wheezing along with this. Denies chest tightness or coughing.  no purulent sputum/ sleeping on 2 lpm ok / poor hfa at baseline, rarely using  Baseline doe = MMRC2 = can't walk a nl pace on a flat grade s sob    No obvious day to day or daytime variability or assoc  cp or chest tightness, subjective wheeze or overt sinus or hb symptoms. No unusual exp hx or h/o childhood pna/ asthma or knowledge of premature birth.  Sleeping ok without nocturnal  or early am exacerbation  of respiratory  c/o's or need for noct saba. Also denies any obvious fluctuation of symptoms with weather or environmental changes or other aggravating or alleviating factors except as outlined above   Current Medications, Allergies, Complete Past Medical History, Past Surgical History, Family History, and Social History were reviewed in [employer] record.  ROS  The following are not active complaints unless bolded sore throat, dysphagia,  dental problems, itching, sneezing,  nasal congestion or excess/ purulent secretions, ear ache,   fever, chills, sweats, unintended wt loss, classically pleuritic or exertional cp, hemoptysis,  orthopnea pnd or leg swelling, presyncope, palpitations, abdominal pain, anorexia, nausea, vomiting, diarrhea  or change in bowel or bladder habits, change in stools or urine, dysuria,hematuria,  rash, arthralgias, visual complaints, headache, numbness, weakness or ataxia or problems with walking or coordination,  change in mood/affect or memory.                 Past Medical History:  HYPERTENSION (ICD-401.9)  COPD (ICD-496)  - PFT's 12/10/03 FEV1 1.12 (36%) ratio 49  - PFT's 05/31/2011   1.31 (46%) ratio 50 and 13% p B2 with DLC0 40% - HFA 75% July 13, 2009    Family History:  Emphysema- Mother and Father (both were smokers)  Asthma- Sister  Heart dz- Father  Lung CA- Mother (was a smoker)   Social History:  Married  Children  Former smoker. Quit in 2005. Smoked up to 2 ppd x 39 years.  Social ETOH  Manager  Objective:   Physical Exam   wt 295 July 13, 2009 vs 05/02/2011  261 > 05/31/2011  256 > 07/30/2015   230  amb anxious mod obese wf nad / vital signs reviewed  HEENT mild turbinate edema. Oropharynx no thrush or excess pnd or cobblestoning. No JVD or cervical adenopathy. Mild accessory muscle hypertrophy. Trachea midline, nl thryroid. Chest was hyperinflated by percussion with diminished breath sounds and moderate increased exp time without wheeze. Hoover sign positive at mid inspiration. Regular rate and rhythm without murmur gallop or rub or increase P2 or edema. Abd: no hsm, nl excursion. Ext warm without cyanosis or clubbing.       I personally reviewed images and agree with radiology impression as follows:  CXR:  06/24/15 Chronic lung changes but no acute pulmonary findings.    Labs ordered/ reviewed:      Chemistry      Component Value Date/Time    NA 137 07/30/2015 1238   K 3.4* 07/30/2015 1238   CL 98 07/30/2015 1238   CO2 29 07/30/2015 1238   BUN 16 07/30/2015 1238   CREATININE 0.72 07/30/2015 1238      Component Value Date/Time   CALCIUM 9.7 07/30/2015 1238   ALKPHOS 65 06/26/2015 0530   AST 20 06/26/2015 0530   ALT 15 06/26/2015 0530   BILITOT 0.8 06/26/2015 0530        Lab Results  Component Value Date   WBC 7.2 07/30/2015   HGB 14.1 07/30/2015   HCT 42.4 07/30/2015   MCV 89.7 07/30/2015   PLT 286.0 07/30/2015     Lab Results  Component Value Date   DDIMER 0.56* 06/24/2015      Lab Results  Component Value Date   TSH 0.352 06/27/2015     Lab Results  Component Value Date   PROBNP 26.0 07/30/2015               Assessment:

## 2015-07-30 NOTE — Patient Instructions (Addendum)
Plan A = automatic =  Stop spiriva and replace with anoro one click each am  Work on inhaler technique:  relax and gently blow all the way out then take a nice smooth deep breath back in, triggering the inhaler at same time you start breathing in.  Hold for up to 5 seconds if you can. Blow out thru nose. Rinse and gargle with water when done     Plan B = backup Only use your albuterol as a rescue medication to be used if you can't catch your breath by resting or doing a relaxed purse lip breathing pattern.  - The less you use it, the better it will work when you need it. - Ok to use up to 2 puffs  every 4 hours if you must but call for immediate appointment if use goes up over your usual need - Don't leave home without it !!  (think of it like the spare tire for your car)  Plan C = crisis  Use nebulizer up to every 4 hours if you already try plan B and it fails to work  Please remember to go to the lab department downstairs for your tests - we will call you with the results when they are available.     Please schedule a follow up office visit in 4 weeks, sooner if needed to See Elsworth Soho or Tammy  Add  rec 02 2lpm at hs and walking outside of home

## 2015-07-31 ENCOUNTER — Encounter: Payer: Self-pay | Admitting: Internal Medicine

## 2015-07-31 NOTE — Assessment & Plan Note (Addendum)
-  O2 at 2l/m w/act for desats 12/25/2013  -ONO + 12/29/13 > O2 At bedtime   - 07/30/2015   Walked RA  2 laps @ 185 ft each stopped due to  Sob with desat to 86%  07/30/2015 rec 02 2lpm at hs and walking outside of home

## 2015-07-31 NOTE — Assessment & Plan Note (Addendum)
-   PFT's 05/31/2011   1.31 (46%) ratio 50 and 13% p B2 with DLC0 40% - unchanged FEV1 1.28 04/2013 07/30/2015  extensive coaching HFA effectiveness =    90%  - try off spiriva and on anoro 07/30/2015 >>>   Symptoms poorly controlled ? Reason in pt with severe copd   DDX of  difficult airways management almost all start with A and  include Adherence, Ace Inhibitors, Acid Reflux, Active Sinus Disease, Alpha 1 Antitripsin deficiency, Anxiety masquerading as Airways dz,  ABPA,  Allergy(esp in young), Aspiration (esp in elderly), Adverse effects of meds,  Active smokers, A bunch of PE's (a small clot burden can't cause this syndrome unless there is already severe underlying pulm or vascular dz with poor reserve) plus two Bs  = Bronchiectasis and Beta blocker use..and one C= CHF    Adherence is always the initial "prime suspect" and is a multilayered concern that requires a "trust but verify" approach in every patient - starting with knowing how to use medications, especially inhalers, correctly, keeping up with refills and understanding the fundamental difference between maintenance and prns vs those medications only taken for a very short course and then stopped and not refilled.  - - The proper method of use, as well as anticipated side effects, of a metered-dose inhaler are discussed and demonstrated to the patient. Improved effectiveness after extensive coaching during this visit to a level of approximately 75 % from a baseline of 25 % but near 95% with dpi so try anoro instead of spiriva  ? Acid (or non-acid) GERD > always difficult to exclude as up to 75% of pts in some series report no assoc GI/ Heartburn symptoms> rec max (24h)  acid suppression and diet restrictions/ reviewed and instructions given in writing.   ? Anxiety/depression/ deconditioning  > usually at the bottom of this list of usual suspects but should be much higher on this pt's based on H and P  ? A bunch of PE's >  D dimer nl - while  A  nl valute  may miss small peripheral pe, the clot burden with sob is moderately high and the d dimer has a very high neg pred value in this setting    ? chf > ruled out by bnp so low   I had an extended discussion with the patient reviewing all relevant studies completed to date and  lasting 15 to 20 minutes of a 25 minute visit    Each maintenance medication was reviewed in detail including most importantly the difference between maintenance and prns and under what circumstances the prns are to be triggered using an action plan format that is not reflected in the computer generated alphabetically organized AVS.    Please see instructions for details which were reviewed in writing and the patient given a copy highlighting the part that I personally wrote and discussed at today's ov.

## 2015-07-31 NOTE — Assessment & Plan Note (Signed)
No evidence chf/ anemia/ thyroid dz/ renal insuff

## 2015-08-02 ENCOUNTER — Telehealth: Payer: Self-pay | Admitting: *Deleted

## 2015-08-02 NOTE — Telephone Encounter (Signed)
-----   Message from Tanda Rockers, MD sent at 07/31/2015  4:32 PM EST ----- 02 2lpm at hs and walking outside of home

## 2015-08-02 NOTE — Progress Notes (Signed)
Quick Note:  Spoke with pt and notified of results per Dr. Wert. Pt verbalized understanding and denied any questions.  ______ 

## 2015-08-02 NOTE — Telephone Encounter (Signed)
LMTCB

## 2015-08-04 NOTE — Telephone Encounter (Signed)
Spoke with pt, aware of 02 recs.  Nothing further needed

## 2015-08-10 ENCOUNTER — Ambulatory Visit: Payer: Self-pay | Admitting: Pulmonary Disease

## 2015-08-16 ENCOUNTER — Telehealth: Payer: Self-pay | Admitting: Pulmonary Disease

## 2015-08-16 DIAGNOSIS — J449 Chronic obstructive pulmonary disease, unspecified: Secondary | ICD-10-CM

## 2015-08-16 MED ORDER — UMECLIDINIUM-VILANTEROL 62.5-25 MCG/INH IN AEPB: INHALATION_SPRAY | RESPIRATORY_TRACT | Status: DC

## 2015-08-16 NOTE — Telephone Encounter (Signed)
Per 07/30/15 OV with MW: Patient Instructions       Plan A = automatic =  Stop spiriva and replace with anoro one click each am  --  Called spoke with pt. She needs her anoro sent in. I have done so. Nothing further needed

## 2015-08-18 IMAGING — CR DG CHEST 2V
2 series · 2 of 2 positions shown · non-contrast
Comparison: Two-view chest 04/24/2013.

CLINICAL DATA: Heaviness in the chest.  Shortness of breath.

EXAM:
CHEST  2 VIEW

[view not recorded (1 of 2)]
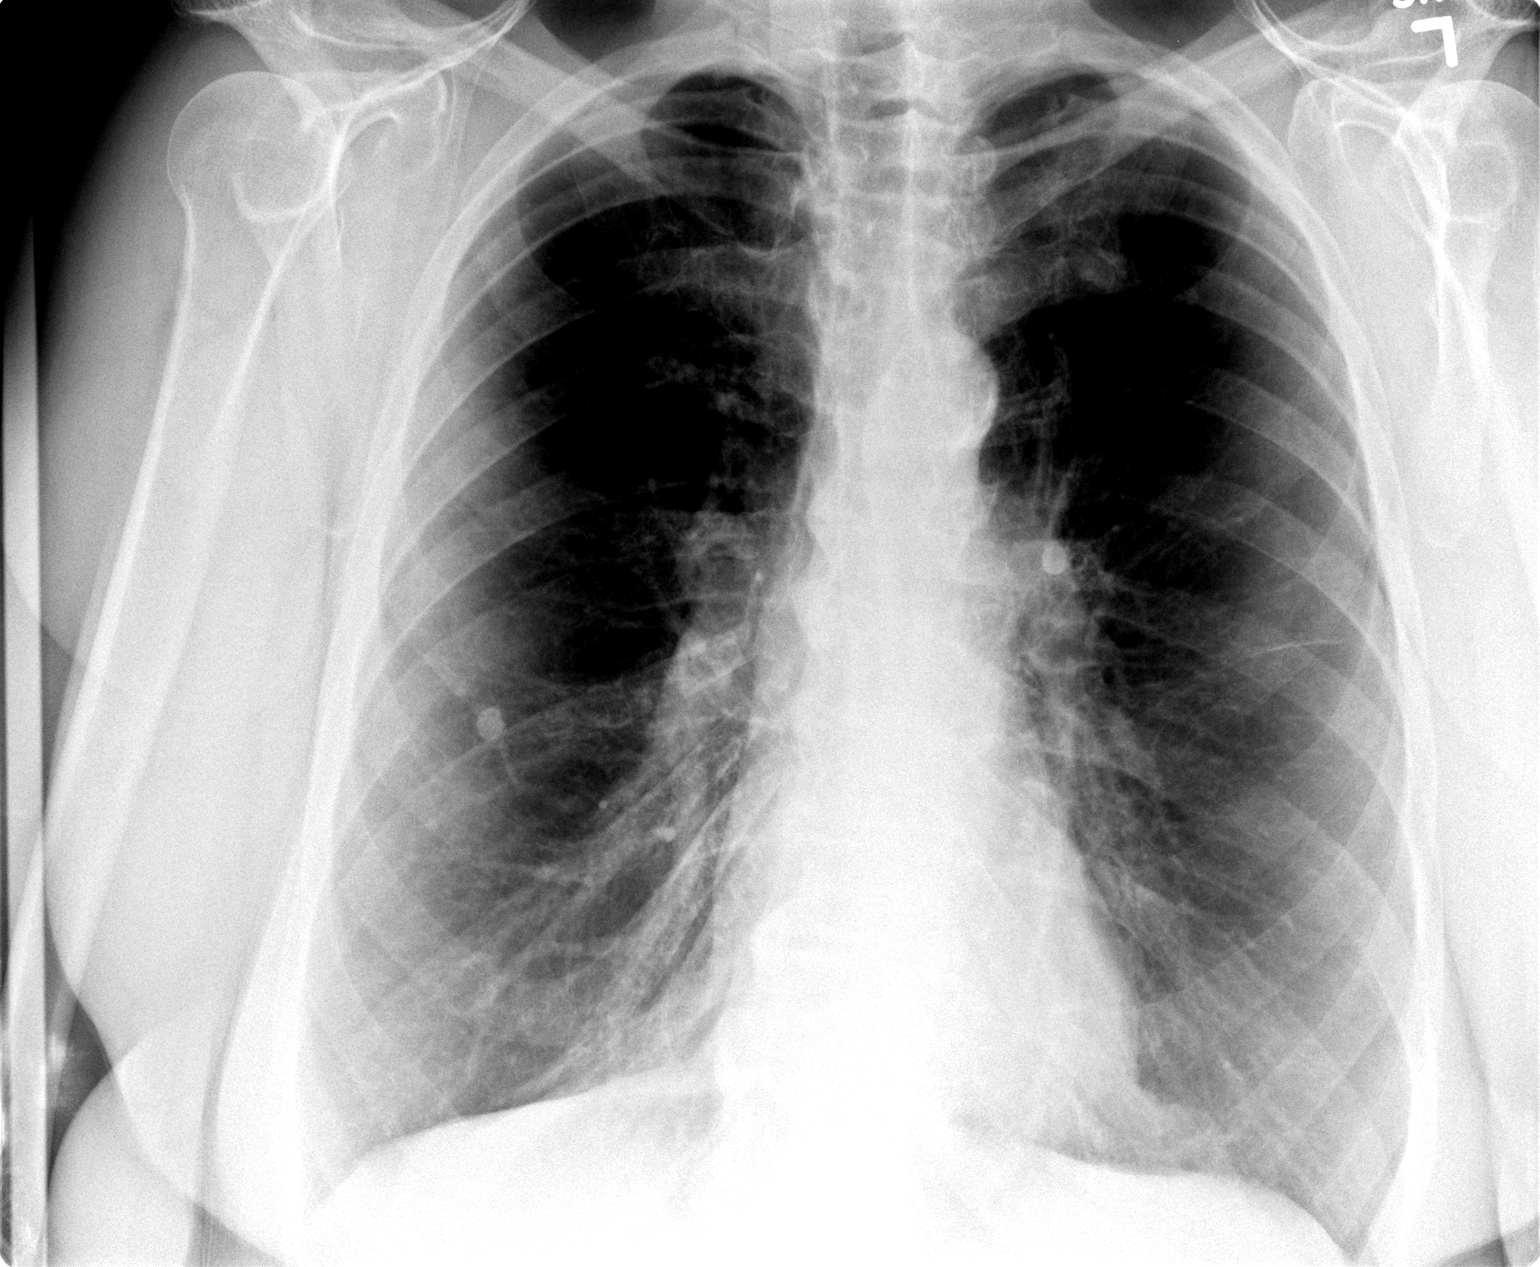

[view not recorded (2 of 2)]
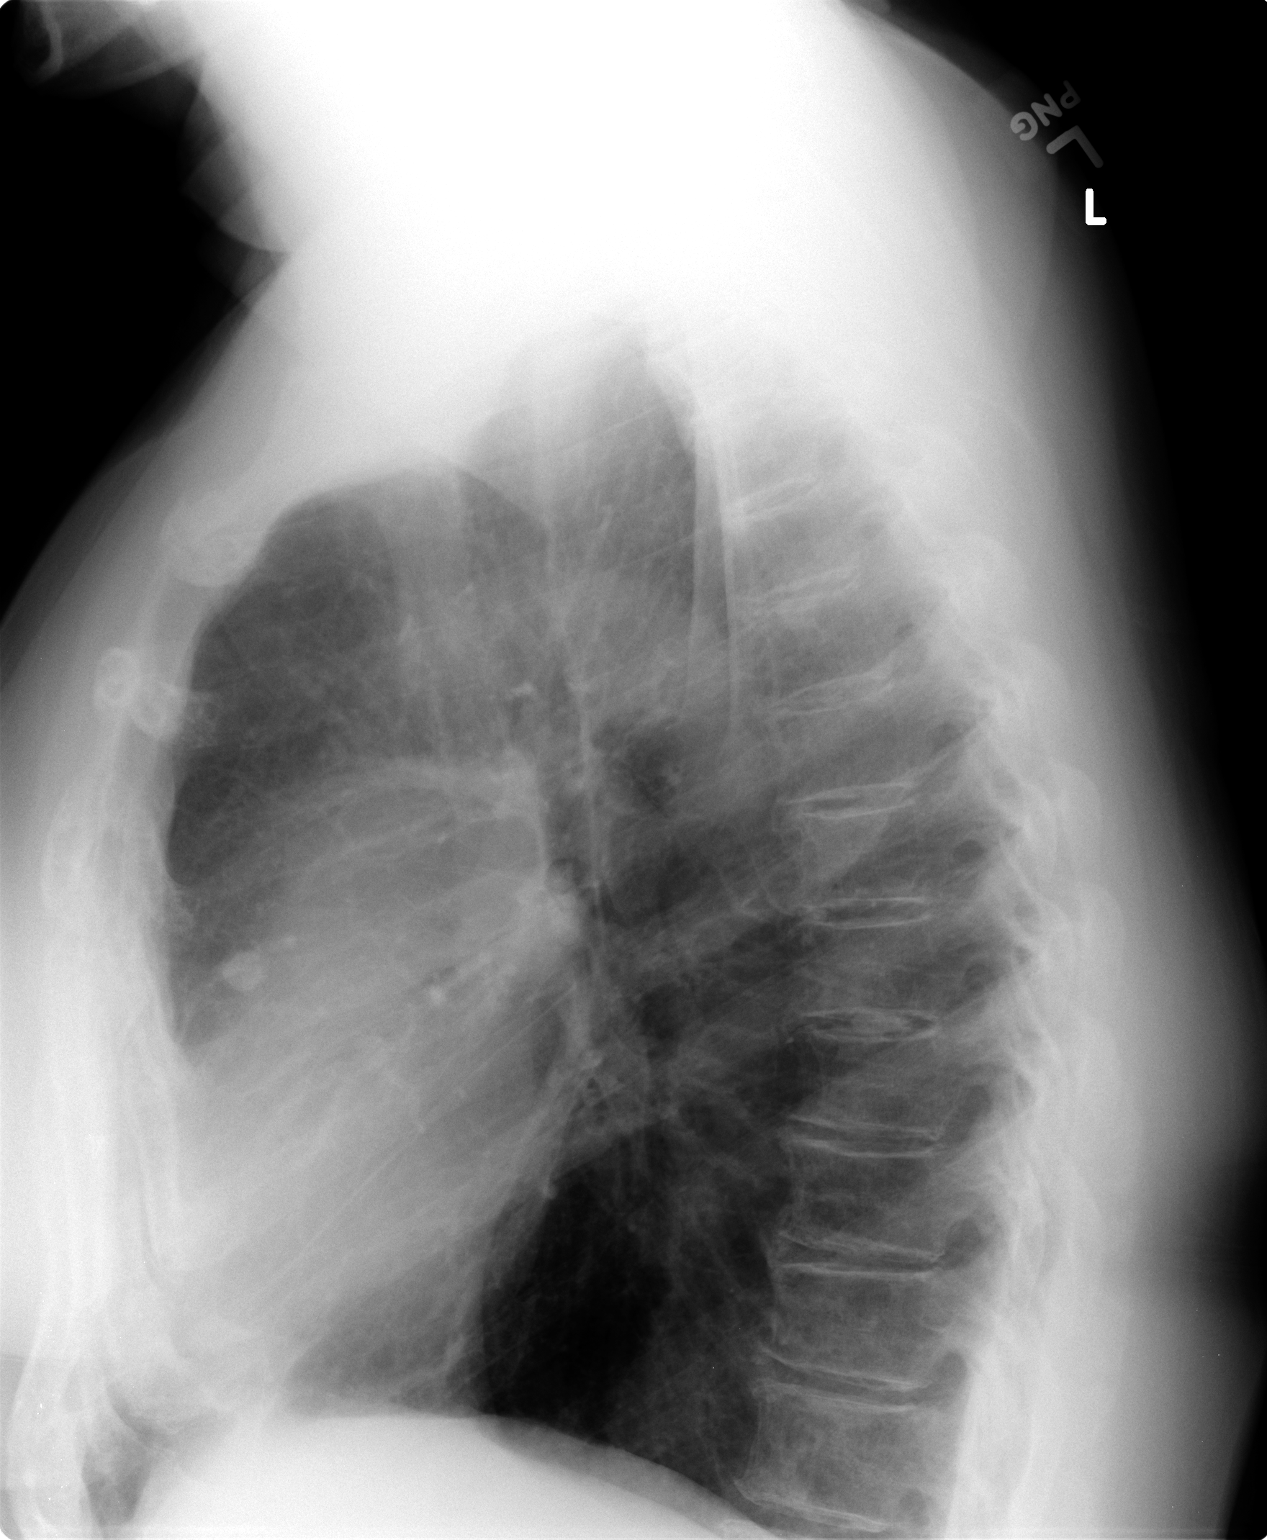

[2 of 2 positions shown; findings below may reference images not displayed]

FINDINGS: The heart size is normal. Emphysematous changes again noted. A
granuloma in the right middle lobe is again noted. The visualized
soft tissues and bony thorax are unremarkable.
IMPRESSION: 1. No acute cardiopulmonary disease or significant interval change.
2. Emphysema

## 2015-08-27 ENCOUNTER — Encounter: Payer: Self-pay | Admitting: Adult Health

## 2015-08-27 ENCOUNTER — Ambulatory Visit (INDEPENDENT_AMBULATORY_CARE_PROVIDER_SITE_OTHER): Payer: Medicare Other | Admitting: Adult Health

## 2015-08-27 VITALS — BP 118/72 | HR 79 | Temp 97.9°F | Ht 72.0 in | Wt 235.0 lb

## 2015-08-27 DIAGNOSIS — J449 Chronic obstructive pulmonary disease, unspecified: Secondary | ICD-10-CM | POA: Diagnosis not present

## 2015-08-27 DIAGNOSIS — R0600 Dyspnea, unspecified: Secondary | ICD-10-CM | POA: Diagnosis not present

## 2015-08-27 DIAGNOSIS — J9611 Chronic respiratory failure with hypoxia: Secondary | ICD-10-CM

## 2015-08-27 DIAGNOSIS — H9209 Otalgia, unspecified ear: Secondary | ICD-10-CM

## 2015-08-27 NOTE — Assessment & Plan Note (Signed)
Continue on ANORO daily  Wear Oxygen 2l/m with activity and At bedtime   Order for POC to DME  Follow up Dr. Elsworth Soho  In 3 months and As needed   Follow up with Priimary MD fo right ear /jaw pain as discussed.

## 2015-08-27 NOTE — Progress Notes (Signed)
   Subjective:    Patient ID: Susan Frye, female    DOB: 11-03-1945, 70 y.o.   MRN: [id number]  HPI69 yowf with gold C COPD ,quit smoking 03/2004 presents for followup of dyspnea.  Her husband passed away in September 01, 2012 after a stroke and she reports mild depression since then. She lost significant weight from 256 pounds in the last 2 years intentionally.  She reports chronic back pain due to spinal stenosis.  Significant tests/ events  PFT's 12/10/03 FEV1 1.12 (36%) ratio 49  - PFT's 05/31/2011 1.31 (46%) ratio 50 and 13% p B2 with DLC0 40%  Spirometry 04/2013 - unchanged FEV1 at 1.28-44% and FVC of 2.09-53% with ratio 61.   Low risk myocardial perfusion scan 2014   12/2013 Acute OV ,D-dimer negative, CT angio neg  6.8.15 ONO >> ++ ONO, continue on O2 at bedtime 2lpm she goes to her daughter's home "a lot" in [place] and was provided a portable concentrator that goes up to 2L. Has started pulm rehab- feels more energetic Could not tell a difference with breo - spiriva helped No sadness of mood   .  >>Did not care for BREO .     08/27/2015 Follow up : COPD and Oxygen dependent w/ act and At bedtime   Patient returns for a 1 month follow-up. Recent changed to [facility] . Feels like it is helping some. Maybe a little less sob.  No flare of cough or  Use Duoneb As needed  .  Uses Oxygen At bedtime  . Uses with activity As needed  .  Wants a portable tank evaluation .   Patient denies any chest pain, orthopnea, PND, increased leg swelling, hemoptysis.  PVX and Prevnar utd.   Remains on oxygen at bedtime. Feels that she does well with this.  Complains of right jaw and ear pain , sore at times. No drainage or fever. No known injury . No chest pain or syncope.    Review of Systems neg for any significant sore throat, dysphagia, itching, sneezing, nasal congestion or excess/ purulent secretions, fever, chills, sweats, unintended wt loss, pleuritic or exertional cp, hempoptysis,  orthopnea pnd or change in chronic leg swelling. Also denies presyncope, palpitations, heartburn, abdominal pain, nausea, vomiting, diarrhea or change in bowel or urinary habits, dysuria,hematuria, rash, arthralgias, visual complaints, headache, numbness weakness or ataxia.     Objective:   Physical Exam Filed Vitals:   08/27/15 1413  BP: 118/72  Pulse: 79  Temp: 97.9 F (36.6 C)  TempSrc: Oral  Height: 6' (1.829 m)  Weight: 235 lb (106.595 kg)  SpO2: 93%    Gen. Pleasant, obese, in no distress ENT - no lesions, no post nasal drip, EAC clear, TM nml , no jaw tenderness, no obvious dental issues.  Neck: No JVD, no thyromegaly, no carotid bruits Lungs: no use of accessory muscles, no dullness to percussion, decreased without rales or rhonchi  Cardiovascular: Rhythm regular, heart sounds  normal, no murmurs or gallops, no peripheral edema Musculoskeletal: No deformities, no cyanosis or clubbing , no tremors        Assessment & Plan:

## 2015-08-27 NOTE — Patient Instructions (Signed)
Continue on ANORO daily  Wear Oxygen 2l/m with activity and At bedtime   Order for POC to DME  Follow up Dr. Elsworth Soho  In 3 months and As needed   Follow up with Priimary MD fo right ear /jaw pain as discussed.

## 2015-08-27 NOTE — Assessment & Plan Note (Addendum)
\  Wear Oxygen 2l/m with activity and At bedtime   Order for POC to DME  Follow up Dr. Elsworth Soho  In 3 months and As needed

## 2015-09-02 DIAGNOSIS — H9209 Otalgia, unspecified ear: Secondary | ICD-10-CM | POA: Insufficient documentation

## 2015-09-02 NOTE — Assessment & Plan Note (Signed)
Right ear /jaw pain ? Etiology  Exam is unrevealing.  Suggested she make ov with PCP to have further evaluated Please contact office for sooner follow up if symptoms do not improve or worsen or seek emergency care

## 2015-09-20 ENCOUNTER — Telehealth: Payer: Self-pay | Admitting: Pulmonary Disease

## 2015-09-20 MED ORDER — TIOTROPIUM BROMIDE MONOHYDRATE 18 MCG IN CAPS: 18.0000 ug | ORAL_CAPSULE | Freq: Every day | RESPIRATORY_TRACT | Status: DC

## 2015-09-20 NOTE — Telephone Encounter (Signed)
Ok to go back to spiriva

## 2015-09-20 NOTE — Telephone Encounter (Signed)
Spoke with pt. She is aware of RA's response. Pt will need a refill on Spiriva. This has been sent in. Nothing further was needed.

## 2015-09-20 NOTE — Telephone Encounter (Signed)
Spoke with pt, states that since starting taking Anoro pt has noted worsening neck pain and leg pain-pain is b/l from knees down; sharp, shooting pains intermittently, same pain in neck.  Pt started having these s/s X5 weeks since starting taking Anoro.  Pt wishes to start back on Spiriva.  I advised pt to stop taking Anoro.    RA please advise on recs for pt.  Thanks!

## 2015-11-11 ENCOUNTER — Ambulatory Visit (INDEPENDENT_AMBULATORY_CARE_PROVIDER_SITE_OTHER): Payer: Medicare Other | Admitting: Adult Health

## 2015-11-11 ENCOUNTER — Encounter: Payer: Self-pay | Admitting: Adult Health

## 2015-11-11 VITALS — BP 124/78 | HR 83 | Temp 97.6°F | Ht 72.0 in | Wt 225.0 lb

## 2015-11-11 DIAGNOSIS — J9611 Chronic respiratory failure with hypoxia: Secondary | ICD-10-CM

## 2015-11-11 DIAGNOSIS — J449 Chronic obstructive pulmonary disease, unspecified: Secondary | ICD-10-CM | POA: Diagnosis not present

## 2015-11-11 MED ORDER — TIOTROPIUM BROMIDE MONOHYDRATE 18 MCG IN CAPS: 18.0000 ug | ORAL_CAPSULE | Freq: Every day | RESPIRATORY_TRACT | Status: DC

## 2015-11-11 NOTE — Progress Notes (Signed)
Subjective:    Patient ID: Susan Frye, female    DOB: Apr 27, 1946, 70 y.o.   MRN: [id number]  HPI  76 yowf with gold C COPD ,quit smoking 03/2004 presents for followup of dyspnea.  Her husband passed away in 2012-09-16 after a stroke and she reports mild depression since then. She lost significant weight from 256 pounds in the last 2 years intentionally.  She reports chronic back pain due to spinal stenosis.  Significant tests/ events PFT's 12/10/03 FEV1 1.12 (36%) ratio 49  - PFT's 05/31/2011 1.31 (46%) ratio 50 and 13% p B2 with DLC0 40%  Spirometry 04/2013 - unchanged FEV1 at 1.28-44% and FVC of 2.09-53% with ratio 61.   Low risk myocardial perfusion scan 2014   12/2013 Acute OV ,D-dimer negative, CT angio neg  6.8.15 ONO >> ++ ONO, continue on O2 at bedtime 2lpm she goes to her daughter's home "a lot" in [place] and was provided a portable concentrator that goes up to 2L. Has started pulm rehab- feels more energetic Could not tell a difference with breo - spiriva helped No sadness of mood   .  >>Did not care for BREO .     11/11/2015 Follow up : COPD and Oxygen dependent w/ act and At bedtime   Patient returns for a 2 month.  Recent changed to [facility] . Feels it caused her some headaches and she changed  Back to Spiriva . Headaches resolved.  Going to visit family for few weeks.  Uses Oxygen At bedtime and uses with activity As needed  .  Patient denies any chest pain, orthopnea, PND, increased leg swelling, hemoptysis. Needs order for POC . PVX and Prevnar utd.  CXR in 06/2015 with COPD changes .  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    Past Medical History  Diagnosis Date  . Hypertension   . COPD (chronic obstructive pulmonary disease) ([place])   . Cancer ([place])     skin  . Depression    Current Outpatient Prescriptions on File Prior to Visit  Medication Sig Dispense Refill  . albuterol (PROVENTIL HFA;VENTOLIN HFA) 108 (90 BASE) MCG/ACT inhaler  Inhale 2 puffs into the lungs every 6 (six) hours as needed for wheezing or shortness of breath.    . gabapentin (NEURONTIN) 100 MG capsule Take 200 mg by mouth at bedtime.    . hydrochlorothiazide (MICROZIDE) 12.5 MG capsule Take 25 mg by mouth daily.     Marland Kitchen HYDROcodone-acetaminophen (NORCO) 10-325 MG tablet Take 1 tablet by mouth every 6 (six) hours as needed. 10 tablet 0  . ipratropium-albuterol (DUONEB) 0.5-2.5 (3) MG/3ML SOLN Take 3 mLs by nebulization 3 (three) times daily. Dx; copd  j44.9 360 mL 0  . sertraline (ZOLOFT) 50 MG tablet Take 50 mg by mouth daily.    Marland Kitchen tiotropium (SPIRIVA) 18 MCG inhalation capsule Place 1 capsule (18 mcg total) into inhaler and inhale daily. 30 capsule 5  . famotidine (PEPCID) 20 MG tablet Take 1 tablet (20 mg total) by mouth 2 (two) times daily. (Patient not taking: Reported on 11/11/2015) 60 tablet 0  . Umeclidinium-Vilanterol (ANORO ELLIPTA) 62.5-25 MCG/INH AEPB 1 puff once daily (Patient not taking: Reported on 11/11/2015) 1 each 11   No current facility-administered medications on file prior to visit.      Review of Systems neg for any significant sore throat, dysphagia, itching, sneezing, nasal congestion or excess/ purulent secretions, fever, chills, sweats, unintended wt loss, pleuritic or exertional cp, hempoptysis,  orthopnea pnd or change in chronic leg swelling. Also denies presyncope, palpitations, heartburn, abdominal pain, nausea, vomiting, diarrhea or change in bowel or urinary habits, dysuria,hematuria, rash, arthralgias, visual complaints, headache, numbness weakness or ataxia.     Objective:   Physical Exam  Filed Vitals:   11/11/15 1550  BP: 124/78  Pulse: 83  Temp: 97.6 F (36.4 C)  TempSrc: Oral  Height: 6' (1.829 m)  Weight: 225 lb (102.059 kg)  SpO2: 94%      Gen. Pleasant, obese, in no distress ENT - no lesions, no post nasal drip, EAC clear, TM nml  Neck: No JVD, no thyromegaly, no carotid bruits Lungs: no use of  accessory muscles, no dullness to percussion, decreased without rales or rhonchi  Cardiovascular: Rhythm regular, heart sounds  normal, no murmurs or gallops, no peripheral edema Musculoskeletal: No deformities, no cyanosis or clubbing , no tremors   Carlise Stofer NP-C  [facility]  11/11/2015      Assessment & Plan:

## 2015-11-11 NOTE — Assessment & Plan Note (Signed)
Compensated on present regimen .   Plan  Wear Oxygen 2l/m with activity and At bedtime   Order for POC to DME  Follow up Dr. Elsworth Soho  In 4  months and As needed

## 2015-11-11 NOTE — Assessment & Plan Note (Signed)
Intolerant to [place]   Refer to [facility] for Lung Cancer screening program in July.  Continue on Spiriva daily .  Wear Oxygen 2l/m with activity and At bedtime   Order for POC to DME  Follow up Dr. Elsworth Soho  In 4  months and As needed

## 2015-11-11 NOTE — Patient Instructions (Addendum)
Refer to [facility] for Lung Cancer screening program in July.  Continue on Spiriva daily .  Wear Oxygen 2l/m with activity and At bedtime   Order for POC to DME  Follow up Dr. Elsworth Soho  In 4  months and As needed

## 2015-11-15 NOTE — Progress Notes (Signed)
Reviewed & agree with plan  

## 2015-12-07 ENCOUNTER — Ambulatory Visit: Payer: Self-pay | Admitting: Pulmonary Disease

## 2016-01-15 ENCOUNTER — Emergency Department (HOSPITAL_COMMUNITY)
Admission: EM | Admit: 2016-01-15 | Discharge: 2016-01-15 | Disposition: A | Discharge status: Home / Self Care | Payer: Medicare Other | Attending: Emergency Medicine | Admitting: Emergency Medicine

## 2016-01-15 ENCOUNTER — Emergency Department (HOSPITAL_COMMUNITY): Payer: Medicare Other

## 2016-01-15 ENCOUNTER — Other Ambulatory Visit: Payer: Self-pay

## 2016-01-15 ENCOUNTER — Encounter (HOSPITAL_COMMUNITY): Payer: Self-pay | Admitting: Emergency Medicine

## 2016-01-15 DIAGNOSIS — F329 Major depressive disorder, single episode, unspecified: Secondary | ICD-10-CM | POA: Insufficient documentation

## 2016-01-15 DIAGNOSIS — Z79899 Other long term (current) drug therapy: Secondary | ICD-10-CM | POA: Insufficient documentation

## 2016-01-15 DIAGNOSIS — Z7982 Long term (current) use of aspirin: Secondary | ICD-10-CM | POA: Diagnosis not present

## 2016-01-15 DIAGNOSIS — Z85828 Personal history of other malignant neoplasm of skin: Secondary | ICD-10-CM | POA: Diagnosis not present

## 2016-01-15 DIAGNOSIS — I10 Essential (primary) hypertension: Secondary | ICD-10-CM | POA: Insufficient documentation

## 2016-01-15 DIAGNOSIS — Z792 Long term (current) use of antibiotics: Secondary | ICD-10-CM | POA: Diagnosis not present

## 2016-01-15 DIAGNOSIS — Z87891 Personal history of nicotine dependence: Secondary | ICD-10-CM | POA: Insufficient documentation

## 2016-01-15 DIAGNOSIS — J441 Chronic obstructive pulmonary disease with (acute) exacerbation: Secondary | ICD-10-CM

## 2016-01-15 DIAGNOSIS — R0602 Shortness of breath: Secondary | ICD-10-CM | POA: Diagnosis present

## 2016-01-15 MED ORDER — ALBUTEROL SULFATE HFA 108 (90 BASE) MCG/ACT IN AERS
2.0000 | INHALATION_SPRAY | Freq: Once | RESPIRATORY_TRACT | Status: AC
  Administered 2016-01-15: 2 via RESPIRATORY_TRACT
  Filled 2016-01-15: qty 6.7

## 2016-01-15 MED ORDER — PREDNISONE 10 MG PO TABS: ORAL_TABLET | ORAL | Status: DC

## 2016-01-15 MED ORDER — METHYLPREDNISOLONE SODIUM SUCC 125 MG IJ SOLR
125.0000 mg | Freq: Once | INTRAMUSCULAR | Status: AC
  Administered 2016-01-15: 125 mg via INTRAVENOUS
  Filled 2016-01-15: qty 2

## 2016-01-15 MED ORDER — ALBUTEROL SULFATE (2.5 MG/3ML) 0.083% IN NEBU
5.0000 mg | INHALATION_SOLUTION | Freq: Once | RESPIRATORY_TRACT | Status: DC
  Filled 2016-01-15: qty 6

## 2016-01-15 MED ORDER — ALBUTEROL (5 MG/ML) CONTINUOUS INHALATION SOLN
10.0000 mg/h | INHALATION_SOLUTION | RESPIRATORY_TRACT | Status: DC
  Administered 2016-01-15: 10 mg/h via RESPIRATORY_TRACT
  Filled 2016-01-15: qty 20

## 2016-01-15 MED ORDER — AEROCHAMBER PLUS FLO-VU MEDIUM MISC
1.0000 | Freq: Once | Status: AC
  Administered 2016-01-15: 1
  Filled 2016-01-15 (×2): qty 1

## 2016-01-15 NOTE — Discharge Instructions (Signed)
Chronic Obstructive Pulmonary Disease Chronic obstructive pulmonary disease (COPD) is a common lung condition in which airflow from the lungs is limited. COPD is a general term that can be used to describe many different lung problems that limit airflow, including both chronic bronchitis and emphysema. If you have COPD, your lung function will probably never return to normal, but there are measures you can take to improve lung function and make yourself feel better. CAUSES   Smoking (common).  Exposure to secondhand smoke.  Genetic problems.  Chronic inflammatory lung diseases or recurrent infections. SYMPTOMS  Shortness of breath, especially with physical activity.  Deep, persistent (chronic) cough with a large amount of thick mucus.  Wheezing.  Rapid breaths (tachypnea).  Gray or bluish discoloration (cyanosis) of the skin, especially in your fingers, toes, or lips.  Fatigue.  Weight loss.  Frequent infections or episodes when breathing symptoms become much worse (exacerbations).  Chest tightness. DIAGNOSIS Your health care provider will take a medical history and perform a physical examination to diagnose COPD. Additional tests for COPD may include:  Lung (pulmonary) function tests.  Chest X-ray.  CT scan.  Blood tests. TREATMENT  Treatment for COPD may include:  Inhaler and nebulizer medicines. These help manage the symptoms of COPD and make your breathing more comfortable.  Supplemental oxygen. Supplemental oxygen is only helpful if you have a low oxygen level in your blood.  Exercise and physical activity. These are beneficial for nearly all people with COPD.  Lung surgery or transplant.  Nutrition therapy to gain weight, if you are underweight.  Pulmonary rehabilitation. This may involve working with a team of health care providers and specialists, such as respiratory, occupational, and physical therapists. HOME CARE INSTRUCTIONS  Take all medicines  (inhaled or pills) as directed by your health care provider.  Avoid over-the-counter medicines or cough syrups that dry up your airway (such as antihistamines) and slow down the elimination of secretions unless instructed otherwise by your health care provider.  If you are a smoker, the most important thing that you can do is stop smoking. Continuing to smoke will cause further lung damage and breathing trouble. Ask your health care provider for help with quitting smoking. He or she can direct you to community resources or hospitals that provide support.  Avoid exposure to irritants such as smoke, chemicals, and fumes that aggravate your breathing.  Use oxygen therapy and pulmonary rehabilitation if directed by your health care provider. If you require home oxygen therapy, ask your health care provider whether you should purchase a pulse oximeter to measure your oxygen level at home.  Avoid contact with individuals who have a contagious illness.  Avoid extreme temperature and humidity changes.  Eat healthy foods. Eating smaller, more frequent meals and resting before meals may help you maintain your strength.  Stay active, but balance activity with periods of rest. Exercise and physical activity will help you maintain your ability to do things you want to do.  Preventing infection and hospitalization is very important when you have COPD. Make sure to receive all the vaccines your health care provider recommends, especially the pneumococcal and influenza vaccines. Ask your health care provider whether you need a pneumonia vaccine.  Learn and use relaxation techniques to manage stress.  Learn and use controlled breathing techniques as directed by your health care provider. Controlled breathing techniques include:  Pursed lip breathing. Start by breathing in (inhaling) through your nose for 1 second. Then, purse your lips as if you were   going to whistle and breathe out (exhale) through the  pursed lips for 2 seconds.  Diaphragmatic breathing. Start by putting one hand on your abdomen just above your waist. Inhale slowly through your nose. The hand on your abdomen should move out. Then purse your lips and exhale slowly. You should be able to feel the hand on your abdomen moving in as you exhale.  Learn and use controlled coughing to clear mucus from your lungs. Controlled coughing is a series of short, progressive coughs. The steps of controlled coughing are: 1. Lean your head slightly forward. 2. Breathe in deeply using diaphragmatic breathing. 3. Try to hold your breath for 3 seconds. 4. Keep your mouth slightly open while coughing twice. 5. Spit any mucus out into a tissue. 6. Rest and repeat the steps once or twice as needed. SEEK MEDICAL CARE IF:  You are coughing up more mucus than usual.  There is a change in the color or thickness of your mucus.  Your breathing is more labored than usual.  Your breathing is faster than usual. SEEK IMMEDIATE MEDICAL CARE IF:  You have shortness of breath while you are resting.  You have shortness of breath that prevents you from:  Being able to talk.  Performing your usual physical activities.  You have chest pain lasting longer than 5 minutes.  Your skin color is more cyanotic than usual.  You measure low oxygen saturations for longer than 5 minutes with a pulse oximeter. MAKE SURE YOU:  Understand these instructions.  Will watch your condition.  Will get help right away if you are not doing well or get worse.   This information is not intended to replace advice given to you by your health care provider. Make sure you discuss any questions you have with your health care provider.   Document Released: 04/19/2005 Document Revised: 07/31/2014 Document Reviewed: 03/06/2013 Elsevier Interactive Patient Education 2016 [employer]  

## 2016-01-15 NOTE — ED Notes (Signed)
Pt reports worsening SOB today unrelieved by home treatment; hx of COPD.

## 2016-01-15 NOTE — ED Provider Notes (Signed)
CSN: [id number]     Arrival date & time 01/15/16  1627 History   First MD Initiated Contact with Patient 01/15/16 1650     Chief Complaint  Patient presents with  . Shortness of Breath     (Consider location/radiation/quality/duration/timing/severity/associated sxs/prior Treatment) Patient is a 70 y.o. female presenting with shortness of breath. The history is provided by the patient.  Shortness of Breath Severity:  Moderate Onset quality:  Gradual Duration:  3 days Timing:  Constant Progression:  Worsening Chronicity:  New Context: URI   Relieved by:  Nothing Worsened by:  Activity Ineffective treatments: steroids. Associated symptoms: cough   Associated symptoms: no fever, no hemoptysis and no sputum production   Risk factors comment:  H/o COPD   Past Medical History  Diagnosis Date  . Hypertension   . COPD (chronic obstructive pulmonary disease) ([place])   . Cancer ([place])     skin  . Depression    Past Surgical History  Procedure Laterality Date  . Cosmetic surgery      eye cosmetic surgery   Family History  Problem Relation Age of Onset  . Emphysema Mother     smoked  . Lung cancer Mother     smoked  . Varicose Veins Mother   . Emphysema Father     smoked  . Heart disease Father   . Diabetes Father   . Hyperlipidemia Father   . Hypertension Father   . Heart attack Father   . Asthma Sister   . Diabetes Sister   . Heart disease Sister   . Diabetes Brother   . Hypertension Brother   . Heart attack Daughter   . Cancer Son    Social History  Substance Use Topics  . Smoking status: Former Smoker -- 2.00 packs/day for 39 years    Types: Cigarettes    Quit date: 04/03/2004  . Smokeless tobacco: None  . Alcohol Use: 2.4 oz/week    4 Cans of beer per week     Comment: Social   OB History    No data available     Review of Systems  Constitutional: Negative for fever.  Respiratory: Positive for cough and shortness of breath. Negative for hemoptysis  and sputum production.   All other systems reviewed and are negative.     Allergies  Ace inhibitors  Home Medications   Prior to Admission medications   Medication Sig Start Date End Date Taking? Authorizing Provider  acetaminophen (TYLENOL) 500 MG tablet Take 500 mg by mouth every 6 (six) hours as needed for mild pain or moderate pain.   Yes Historical Provider, MD  albuterol (PROVENTIL HFA;VENTOLIN HFA) 108 (90 BASE) MCG/ACT inhaler Inhale 2 puffs into the lungs every 6 (six) hours as needed for wheezing or shortness of breath.   Yes Historical Provider, MD  aspirin 81 MG chewable tablet Chew by mouth daily.   Yes Historical Provider, MD  azithromycin (ZITHROMAX) 250 MG tablet as directed. Standard package instructions. 01/12/16  Yes Historical Provider, MD  gabapentin (NEURONTIN) 300 MG capsule Take 600 mg by mouth at bedtime.   Yes Historical Provider, MD  guaiFENesin (MUCINEX) 600 MG 12 hr tablet Take 600 mg by mouth 2 (two) times daily.   Yes Historical Provider, MD  hydrochlorothiazide (HYDRODIURIL) 25 MG tablet Take 25 mg by mouth daily.   Yes Historical Provider, MD  HYDROcodone-acetaminophen (NORCO) 10-325 MG tablet Take 1 tablet by mouth every 6 (six) hours as needed. 06/29/15  Yes Florencia Reasons,  MD  HYDROcodone-acetaminophen (NORCO) 10-325 MG tablet Take 1 tablet by mouth 3 (three) times daily as needed. 12/27/15  Yes Historical Provider, MD  ipratropium-albuterol (DUONEB) 0.5-2.5 (3) MG/3ML SOLN Take 3 mLs by nebulization 3 (three) times daily. Dx; copd  j44.9 06/29/15  Yes Florencia Reasons, MD  LORazepam (ATIVAN) 0.5 MG tablet Take 2 tablets by mouth at bedtime.   Yes Historical Provider, MD  predniSONE (DELTASONE) 20 MG tablet Take 1 tablet three times daily for 3 days, 1 tablet two times daily for 3 days, then 1 tablet one time daily for 3 days. 01/12/16  Yes Historical Provider, MD  sertraline (ZOLOFT) 50 MG tablet Take 75 mg by mouth daily.    Yes Historical Provider, MD  tiotropium (SPIRIVA)  18 MCG inhalation capsule Place 1 capsule (18 mcg total) into inhaler and inhale daily. 11/11/15  Yes Tammy S Parrett, NP  famotidine (PEPCID) 20 MG tablet Take 1 tablet (20 mg total) by mouth 2 (two) times daily. Patient not taking: Reported on 11/11/2015 06/28/15   Florencia Reasons, MD  Umeclidinium-Vilanterol [facility] ELLIPTA) 62.5-25 MCG/INH AEPB 1 puff once daily Patient not taking: Reported on 11/11/2015 08/16/15   Tanda Rockers, MD   BP 144/71 mmHg  Pulse 110  Temp(Src) 98.8 F (37.1 C) (Oral)  Resp 25  SpO2 96% Physical Exam  Constitutional: She is oriented to person, place, and time. She appears well-developed and well-nourished. No distress.  HENT:  Head: Normocephalic.  Eyes: Conjunctivae are normal.  Neck: Neck supple. No tracheal deviation present.  Cardiovascular: Normal rate, regular rhythm and normal heart sounds.   Pulmonary/Chest: Effort normal. No respiratory distress. She has wheezes (diffuse inspiratry and expiratory).  Abdominal: Soft. She exhibits no distension. There is no tenderness.  Neurological: She is alert and oriented to person, place, and time.  Skin: Skin is warm and dry.  Psychiatric: She has a normal mood and affect.  Vitals reviewed.   ED Course  Procedures (including critical care time) Labs Review Labs Reviewed - No data to display  Imaging Review Dg Chest 2 View  01/15/2016  CLINICAL DATA:  Worsening shortness of breath today, no relief from home treatment, COPD, hypertension EXAM: CHEST  2 VIEW COMPARISON:  06/24/2015 FINDINGS: Normal heart size, mediastinal contours, and pulmonary vascularity. Atherosclerotic calcification aorta. Calcified granuloma mid RIGHT lung. Emphysematous changes without infiltrate, pleural effusion, or pneumothorax. Endplate spur formation thoracic spine. Bony demineralization. IMPRESSION: COPD changes without acute infiltrate. Aortic atherosclerosis. Electronically Signed   By: Lavonia Dana M.D.   On: 01/15/2016 17:27   I have  personally reviewed and evaluated these images and lab results as part of my medical decision-making.   EKG Interpretation   Date/Time:  Saturday January 15 2016 16:46:48 EDT Ventricular Rate:  99 PR Interval:    QRS Duration: 83 QT Interval:  326 QTC Calculation: 419 R Axis:   87 Text Interpretation:  Sinus tachycardia Atrial premature complexes  Anteroseptal infarct, age indeterminate Confirmed by ZAVITZ MD, JOSHUA  [phone]) on 01/16/2016 10:03:19 AM      MDM   Final diagnoses:  COPD exacerbation ([place])    70 y.o. female presents with cough and shortness of breath from home. Having wheezing, apparent COPD exacerbation without infiltrate on CXR, no signs of acute respiratory failure or bacterial pneumonia currently. Improved after interventions here. Provided MDI, steroid taper for home given lack of response to 20 mg TID before today. Plan to follow up with PCP as needed and return precautions discussed for  worsening or new concerning symptoms.     Leo Grosser, MD 01/17/16 [phone]

## 2016-01-19 ENCOUNTER — Encounter: Payer: Self-pay | Admitting: Obstetrics and Gynecology

## 2016-03-20 ENCOUNTER — Encounter: Payer: Self-pay | Admitting: Pulmonary Disease

## 2016-03-20 ENCOUNTER — Ambulatory Visit (INDEPENDENT_AMBULATORY_CARE_PROVIDER_SITE_OTHER): Payer: Medicare Other | Admitting: Pulmonary Disease

## 2016-03-20 DIAGNOSIS — Z23 Encounter for immunization: Secondary | ICD-10-CM | POA: Diagnosis not present

## 2016-03-20 DIAGNOSIS — J449 Chronic obstructive pulmonary disease, unspecified: Secondary | ICD-10-CM | POA: Diagnosis not present

## 2016-03-20 MED ORDER — UMECLIDINIUM-VILANTEROL 62.5-25 MCG/INH IN AEPB: 1.0000 | INHALATION_SPRAY | Freq: Every day | RESPIRATORY_TRACT | 5 refills | Status: DC

## 2016-03-20 NOTE — Patient Instructions (Addendum)
It is nice to meet you today. We will walk you today to see if you qualify for daytime oxygen.  Continue wearing your night time oxygen at 2 L. Please take your Anoro one puff once daily. Continue using your rescue inhaler as needed for break through shortness of breath up to every 6 hours. We will send in a prescription for your Anoro ( Maintenance medication) Remember to rinse mouth after use. We will refer you for lung cancer screening. Remember to get the flu shot today. Follow up with Dr. Elsworth Soho in  4 months. Please contact office for sooner follow up if symptoms do not improve or worsen or seek emergency care

## 2016-03-20 NOTE — Assessment & Plan Note (Addendum)
Stable interval for COPD with one ED visit and discharge same day.  We will walk you today to see if you qualify for daytime oxygen.  Continue wearing your oxygen at 2 L at night. Please take your Anoro one puff once daily. Continue using your rescue inhaler as needed for break through shortness of breath up to every 6 hours. We will send in a prescription for your Anoro ( Maintenance medication) Remember to rinse mouth after use. We will refer you for lung cancer screening. Remember flu shot today. Follow up with Dr. Elsworth Soho in  4 months. Please contact office for sooner follow up if symptoms do not improve or worsen or seek emergency care

## 2016-03-20 NOTE — Progress Notes (Signed)
History of Present Illness Susan Frye is a 70 y.o. female with Gold C COPD , on nocturnal oxygen, and oxygen with activity, followed by Dr. Elsworth Soho.  12 yowf with gold C COPD ,quit smoking 03/2004 presents for followup of dyspnea. HPI: Her husband passed away in September 11, 2012 after a stroke and she reports mild depression since then. She lost significant weight from 256 pounds in the last 2 years intentionally.  She reports chronic back pain due to spinal stenosis.  03/20/2016 Follow Up OV for COPD: Pt. Presents to the office for COPD follow up. She is wearing her oxygen at night, every night. She does use her oxygen during the day 1-2 times weekly for periods od exertion. She feels that since using her walker for her back pain she has less exertional dyspnea. She is having cortisone injections this Wednesday in her low back for pain. She wonders if Dr. Elsworth Soho would clear her for surgery for her low back pain. Her breathing has been stable for the last 4 months. She did go the the ED in 12/2015 for shortness of breath, and cough. She was treated for an exacerbation and then discharged the same day. She is currently using her albuterol inhaler 4 times daily, she is doing her Duoneb treatments 2 times daily, morning and night. She is supposed to be on Spiriva,but it became too expensive so she quit taking it.She has also stopped taking her Anoro. We had a discussion about taking her maintenance medications daily without fail. We discussed the difference between maintenance medications and rescue medications.   Tests Significant tests/ events  PFT's 12/10/03 FEV1 1.12 (36%) ratio 49  - PFT's 05/31/2011 1.31 (46%) ratio 50 and 13% p B2 with DLC0 40%  Spirometry 04/2013 - unchanged FEV1 at 1.28-44% and FVC of 2.09-53% with ratio 61.   Low risk myocardial perfusion scan 2014   12/2013 Acute OV ,D-dimer negative, CT angio neg  6.8.15 ONO >> ++ ONO, continue on O2 at bedtime 2lpm she goes to her  daughter's home "a lot" in [place] and was provided a portable concentrator that goes up to 2L. Has started pulm rehab- feels more energetic Could not tell a difference with breo - spiriva helped No sadness of mood   .  >>Did not care for BREO .    Past medical hx Past Medical History:  Diagnosis Date  . Cancer ([place])    skin  . COPD (chronic obstructive pulmonary disease) ([place])   . Depression   . Hypertension      Past surgical hx, Family hx, Social hx all reviewed.  Current Outpatient Prescriptions on File Prior to Visit  Medication Sig  . acetaminophen (TYLENOL) 500 MG tablet Take 500 mg by mouth every 6 (six) hours as needed for mild pain or moderate pain.  Marland Kitchen albuterol (PROVENTIL HFA;VENTOLIN HFA) 108 (90 BASE) MCG/ACT inhaler Inhale 2 puffs into the lungs every 6 (six) hours as needed for wheezing or shortness of breath.  Marland Kitchen aspirin 81 MG chewable tablet Chew by mouth daily.  . famotidine (PEPCID) 20 MG tablet Take 1 tablet (20 mg total) by mouth 2 (two) times daily.  Marland Kitchen gabapentin (NEURONTIN) 300 MG capsule Take 600 mg by mouth at bedtime.  . hydrochlorothiazide (HYDRODIURIL) 25 MG tablet Take 25 mg by mouth daily.  Marland Kitchen HYDROcodone-acetaminophen (NORCO) 10-325 MG tablet Take 1 tablet by mouth 3 (three) times daily as needed.  Marland Kitchen ipratropium-albuterol (DUONEB) 0.5-2.5 (3) MG/3ML SOLN Take 3 mLs by  nebulization 3 (three) times daily. Dx; copd  j44.9  . LORazepam (ATIVAN) 0.5 MG tablet Take 2 tablets by mouth at bedtime.  . sertraline (ZOLOFT) 50 MG tablet Take 100 mg by mouth daily.   Marland Kitchen guaiFENesin (MUCINEX) 600 MG 12 hr tablet Take 600 mg by mouth 2 (two) times daily.   No current facility-administered medications on file prior to visit.      Allergies  Allergen Reactions  . Ace Inhibitors     Cough    Review Of Systems:  Constitutional:   No  weight loss, night sweats,  Fevers, chills, fatigue, or  lassitude.  HEENT:   No headaches,  Difficulty swallowing,   Tooth/dental problems, or  Sore throat,                No sneezing, itching, ear ache, nasal congestion, post nasal drip,   CV:  No chest pain,  Orthopnea, PND, swelling in lower extremities, anasarca, dizziness, palpitations, syncope.   GI  No heartburn, indigestion, abdominal pain, nausea, vomiting, diarrhea, change in bowel habits, loss of appetite, bloody stools.   Resp: + shortness of breath with exertion not at rest.  No excess mucus, no productive cough,  No non-productive cough,  No coughing up of blood.  No change in color of mucus.  No wheezing.  No chest wall deformity  Skin: no rash or lesions.  GU: no dysuria, change in color of urine, no urgency or frequency.  No flank pain, no hematuria   MS:  + joint pain ( low back)  No  swelling.  No decreased range of motion.  + back pain.  Psych:  No change in mood or affect. No depression or anxiety.  No memory loss.   Vital Signs BP 124/68 (BP Location: Right Arm, Cuff Size: Normal)   Pulse (!) 102   Ht 6' (1.829 m)   Wt 223 lb 3.2 oz (101.2 kg)   SpO2 91%   BMI 30.27 kg/m    Physical Exam:  General- No distress,  A&Ox3, elderly female using walker, forgetful. ENT: No sinus tenderness, TM clear, pale nasal mucosa, no oral exudate,no post nasal drip, no LAN Cardiac: S1, S2, regular rate and rhythm, no murmur Chest: No wheeze/ rales/ dullness; no accessory muscle use, no nasal flaring, no sternal retractions Abd.: Soft Non-tender Ext: No clubbing cyanosis, edema Neuro:  normal strength Skin: No rashes, warm and dry Psych: normal mood and behavior   Assessment/Plan  COPD GOLD III Stable interval for COPD with one ED visit and discharge same day.  We will walk you today to see if you qualify for daytime oxygen.  Continue wearing your oxygen at 2 L at night. Please take your Anoro one puff once daily. Continue using your rescue inhaler as needed for break through shortness of breath up to every 6 hours. We will send  in a prescription for your Anoro ( Maintenance medication) Remember to rinse mouth after use. We will refer you for lung cancer screening. Remember flu shot today. Follow up with Dr. Elsworth Soho in  4 months. Please contact office for sooner follow up if symptoms do not improve or worsen or seek emergency care      Magdalen Spatz, NP 03/20/2016  2:38 PM   She has several issues today  exam-decreased breath sounds bilateral, no pedal edema, and platelets with walker -She seems to have stopped her long-acting inhalers and is only using albuterol, this has caused increased tremors-we will put her  back on Anoro daily, Spiriva also worked for her but this was too expensive in the past -She did desaturate on exertion to 86% and will be evaluated for a portable concentrator -She qualifies for lung cancer screening and this will be set up, I note CT angiogram in 2015 did not show any worrisome nodules  - flu shot today - we discussed surgery or spina stenosis that is being planned by Dr. Arnoldo Morale, she would be moderate to high risk for postoperative complications, ultimately it would be based on discussion between her and the surgeon  Rigoberto Noel MD

## 2016-03-22 ENCOUNTER — Other Ambulatory Visit: Payer: Self-pay | Admitting: Acute Care

## 2016-03-22 DIAGNOSIS — Z87891 Personal history of nicotine dependence: Secondary | ICD-10-CM

## 2016-04-17 ENCOUNTER — Telehealth: Payer: Self-pay | Admitting: Acute Care

## 2016-04-17 NOTE — Telephone Encounter (Signed)
Spoke with pt. She has some questions about the lung screening program, not drug screening program. Pt wanted to make sure that her insurance would cover this. Advised her that the CT's are precerted with insurance before they are scheduled. All of her other questions were answered. Nothing further was needed.

## 2016-04-20 ENCOUNTER — Encounter: Payer: Self-pay | Admitting: Acute Care

## 2016-04-20 ENCOUNTER — Ambulatory Visit (INDEPENDENT_AMBULATORY_CARE_PROVIDER_SITE_OTHER): Payer: Medicare Other | Admitting: Acute Care

## 2016-04-20 ENCOUNTER — Ambulatory Visit (INDEPENDENT_AMBULATORY_CARE_PROVIDER_SITE_OTHER)
Admission: RE | Admit: 2016-04-20 | Discharge: 2016-04-20 | Disposition: A | Discharge status: Home / Self Care | Payer: Medicare Other | Source: Ambulatory Visit | Attending: Acute Care | Admitting: Acute Care

## 2016-04-20 DIAGNOSIS — Z87891 Personal history of nicotine dependence: Secondary | ICD-10-CM | POA: Diagnosis not present

## 2016-04-20 NOTE — Progress Notes (Signed)
Shared Decision Making Visit Lung Cancer Screening Program [phone])   Eligibility:  Age 70 y.o.  Pack Years Smoking History Calculation 38 pack years (# packs/per year x # years smoked)  Recent History of coughing up blood  no  Unexplained weight loss? no ( >Than 15 pounds within the last 6 months )  Prior History Lung / other cancer no (Diagnosis within the last 5 years already requiring surveillance chest CT Scans).  Smoking Status Former Smoker  Former Smokers: Years since quit: 12 years  Quit Date: 2005  Visit Components:  Discussion included one or more decision making aids. yes  Discussion included risk/benefits of screening. yes  Discussion included potential follow up diagnostic testing for abnormal scans. yes  Discussion included meaning and risk of over diagnosis. yes  Discussion included meaning and risk of False Positives. yes  Discussion included meaning of total radiation exposure. yes  Counseling Included:  Importance of adherence to annual lung cancer LDCT screening. yes  Impact of comorbidities on ability to participate in the program. yes  Ability and willingness to under diagnostic treatment. yes  Smoking Cessation Counseling:  Current Smokers:   Discussed importance of smoking cessation. NA  Information about tobacco cessation classes and interventions provided to patient. NA  Patient provided with "ticket" for LDCT Scan. yes  Symptomatic Patient.   Counseling  Diagnosis Code: Tobacco Use Z72.0  Asymptomatic Patient yes  Counseling (Intermediate counseling: > three minutes counseling) [id number]  Former Smokers:   Discussed the importance of maintaining cigarette abstinence. yes  Diagnosis Code: Personal History of Nicotine Dependence. [id number]  Information about tobacco cessation classes and interventions provided to patient. Yes  Patient provided with "ticket" for LDCT Scan. yes  Written Order for Lung Cancer Screening with LDCT  placed in Epic. Yes (CT Chest Lung Cancer Screening Low Dose W/O CM) [id number] Z12.2-Screening of respiratory organs Z87.891-Personal history of nicotine dependence  I spent 20 minutes of face to face time with *Ms Betteridge discussing the risks and benefits of lung cancer screening. We viewed a power point together that explained in detail the above noted topics. We took the time to pause the power point at intervals to allow for questions to be asked and answered to ensure understanding. We discussed that she had taken the single most powerful action possible to decrease her risk of developing lung cancer when she quit smoking. I counseled her to remain smoke free, and to contact me if she ever had the desire to smoke again so that I can provide resources and tools to help support the effort to remain smoke free. We discussed the time and location of the scan, and that either [place] or I will call with the results within  24-48 hours of receiving them. She has my card and contact information in the event she needs to speak with me, in addition to a copy of the power point we reviewed as a resource. She verbalized understanding of all of the above and had no further questions upon leaving the office.    Magdalen Spatz, NP ,04/20/2016

## 2016-04-21 ENCOUNTER — Telehealth: Payer: Self-pay | Admitting: Acute Care

## 2016-04-21 DIAGNOSIS — Z87891 Personal history of nicotine dependence: Secondary | ICD-10-CM

## 2016-04-21 NOTE — Telephone Encounter (Signed)
I will call the patient her results and schedule her CT for 3 months. Thanks so much

## 2016-04-21 NOTE — Telephone Encounter (Signed)
[facility] called about pt's lung cancer screening CT. Advised them that this report was in [place]. Will route message to have Judson Roch address CT.

## 2016-04-21 NOTE — Telephone Encounter (Signed)
I called to give Susan Frye the results of her scan. There was no answer at the number she requested I call. I left a message explaining that I will call her on Monday with her scan results.

## 2016-04-25 ENCOUNTER — Telehealth: Payer: Self-pay | Admitting: Acute Care

## 2016-04-25 NOTE — Telephone Encounter (Signed)
I have attempted to call the patient with her CT chest results. There was no answer at either her home or cell phone. I have left messages on both asking her to return the call with our contact information.I will await her return call.

## 2016-04-26 ENCOUNTER — Telehealth: Payer: Self-pay | Admitting: Acute Care

## 2016-04-26 NOTE — Telephone Encounter (Signed)
Have called the results of Susan Frye's low dose CT scan.  The scan was read as a Lung RADS 4 A : suspicious findings, either short term follow up in 3 months or alternatively  PET Scan evaluation may be considered when there is a solid component of  8 mm or larger. I had a long discussion with her about the need to follow up with a 3 month follow up scan to make sure this area has not changed. All questions were answered. She understands that she will repeat the scan in 3 months  as follow up. She verbalized understanding of the above and had no further questions upon completing the call. The order for the scan has been placed, and the results have been faxed to her PCP.Marland Kitchen

## 2016-06-01 ENCOUNTER — Ambulatory Visit (INDEPENDENT_AMBULATORY_CARE_PROVIDER_SITE_OTHER): Payer: Medicare Other | Admitting: Adult Health

## 2016-06-01 ENCOUNTER — Encounter: Payer: Self-pay | Admitting: Adult Health

## 2016-06-01 VITALS — BP 138/82 | HR 92 | Ht 72.0 in

## 2016-06-01 DIAGNOSIS — J9611 Chronic respiratory failure with hypoxia: Secondary | ICD-10-CM

## 2016-06-01 DIAGNOSIS — J449 Chronic obstructive pulmonary disease, unspecified: Secondary | ICD-10-CM | POA: Diagnosis not present

## 2016-06-01 NOTE — Progress Notes (Signed)
Subjective:    Patient ID: Susan Frye, female    DOB: July 24, 1946, 70 y.o.   MRN: [id number]  HPI  57 yowf with gold C COPD ,quit smoking 03/2004 presents for followup of dyspnea.  Her husband passed away in Oct 08, 2012 after a stroke and she reports mild depression since then. She lost significant weight from 256 pounds in the last 2 years intentionally.  She reports chronic back pain due to spinal stenosis.  Significant tests/ events PFT's 12/10/03 FEV1 1.12 (36%) ratio 49  - PFT's 05/31/2011 1.31 (46%) ratio 50 and 13% p B2 with DLC0 40%  Spirometry 04/2013 - unchanged FEV1 at 1.28-44% and FVC of 2.09-53% with ratio 61.   Low risk myocardial perfusion scan 2014   12/2013 Acute OV ,D-dimer negative, CT angio neg  6.8.15 ONO >> ++ ONO, continue on O2 at bedtime 2lpm she goes to her daughter's home "a lot" in [place] and was provided a portable concentrator that goes up to 2L. Has started pulm rehab- feels more energetic Could not tell a difference with breo - spiriva helped No sadness of mood   .  >>Did not care for BREO .     06/01/2016 Follow up : COPD and Oxygen dependent w/ act and At bedtime   Pt returns for 2 month follow up .  Says overall she is doing okay without flare of cough or wheezing.  Uses Oxygen At bedtime and with activity .  Wants a POC but DME did not have one.  Wants to see if she can get one now.  Patient denies any chest pain, orthopnea, PND, increased leg swelling, hemoptysis. PVX and Prevnar utd.  Was started on ANORO last ov, she is tolerating .  Discused pulm rehab , would like to try to do this again.    Past Medical History:  Diagnosis Date  . Cancer ([place])    skin  . COPD (chronic obstructive pulmonary disease) ([place])   . Depression   . Hypertension    Current Outpatient Prescriptions on File Prior to Visit  Medication Sig Dispense Refill  . acetaminophen (TYLENOL) 500 MG tablet Take 500 mg by mouth every 6 (six) hours as needed for  mild pain or moderate pain.    Marland Kitchen albuterol (PROVENTIL HFA;VENTOLIN HFA) 108 (90 BASE) MCG/ACT inhaler Inhale 2 puffs into the lungs every 6 (six) hours as needed for wheezing or shortness of breath.    . gabapentin (NEURONTIN) 300 MG capsule Take 600 mg by mouth at bedtime.    . hydrochlorothiazide (HYDRODIURIL) 25 MG tablet Take 25 mg by mouth daily.    Marland Kitchen HYDROcodone-acetaminophen (NORCO) 10-325 MG tablet Take 1 tablet by mouth 3 (three) times daily as needed.    Marland Kitchen ipratropium-albuterol (DUONEB) 0.5-2.5 (3) MG/3ML SOLN Take 3 mLs by nebulization 3 (three) times daily. Dx; copd  j44.9 360 mL 0  . LORazepam (ATIVAN) 0.5 MG tablet Take 2 tablets by mouth at bedtime.    Marland Kitchen umeclidinium-vilanterol (ANORO ELLIPTA) 62.5-25 MCG/INH AEPB Inhale 1 puff into the lungs daily. 60 each 5  . aspirin 81 MG chewable tablet Chew by mouth daily.    . famotidine (PEPCID) 20 MG tablet Take 1 tablet (20 mg total) by mouth 2 (two) times daily. (Patient not taking: Reported on 06/01/2016) 60 tablet 0  . guaiFENesin (MUCINEX) 600 MG 12 hr tablet Take 600 mg by mouth 2 (two) times daily.    . sertraline (ZOLOFT) 50 MG tablet Take 100 mg by  mouth daily.      No current facility-administered medications on file prior to visit.       Review of Systems neg for any significant sore throat, dysphagia, itching, sneezing, nasal congestion or excess/ purulent secretions, fever, chills, sweats, unintended wt loss, pleuritic or exertional cp, hempoptysis, orthopnea pnd or change in chronic leg swelling. Also denies presyncope, palpitations, heartburn, abdominal pain, nausea, vomiting, diarrhea or change in bowel or urinary habits, dysuria,hematuria, rash, arthralgias, visual complaints, headache, numbness weakness or ataxia.     Objective:   Physical Exam  Vitals:   06/01/16 1628  BP: 138/82  Pulse: 92  SpO2: 92%  Height: 6' (1.829 m)      Gen. Pleasant, obese, in no distress ENT - no lesions, no post nasal drip, EAC  clear, TM nml  Neck: No JVD, no thyromegaly, no carotid bruits Lungs: no use of accessory muscles, no dullness to percussion, decreased without rales or rhonchi  Cardiovascular: Rhythm regular, heart sounds  normal, no murmurs or gallops, no peripheral edema Musculoskeletal: No deformities, no cyanosis or clubbing , no tremors   Tammy Parrett NP-C  [facility]  06/01/2016

## 2016-06-01 NOTE — Patient Instructions (Addendum)
Continue on ANORO daily  Wear Oxygen 2l/m with activity and At bedtime   Order for POC to DME  Order for ONO on 2l/m .  Refer to pulmonary rehab at [facility]  Follow up Dr. Elsworth Soho  In 3 months and As needed

## 2016-06-05 NOTE — Assessment & Plan Note (Signed)
Cont on current regimen   Plan  Patient Instructions  Continue on ANORO daily  Wear Oxygen 2l/m with activity and At bedtime   Order for POC to DME  Order for ONO on 2l/m .  Refer to pulmonary rehab at [facility]  Follow up Dr. Elsworth Soho  In 3 months and As needed

## 2016-06-05 NOTE — Assessment & Plan Note (Signed)
Cont on O2 .  Order for POC  

## 2016-06-30 ENCOUNTER — Encounter: Payer: Self-pay | Admitting: Acute Care

## 2016-07-06 ENCOUNTER — Encounter (HOSPITAL_COMMUNITY): Payer: Medicare Other

## 2016-07-20 ENCOUNTER — Ambulatory Visit: Payer: Self-pay | Admitting: Pulmonary Disease

## 2016-07-31 ENCOUNTER — Encounter: Payer: Self-pay | Admitting: Neurology

## 2016-07-31 ENCOUNTER — Ambulatory Visit (INDEPENDENT_AMBULATORY_CARE_PROVIDER_SITE_OTHER): Payer: Medicare Other | Admitting: Neurology

## 2016-07-31 VITALS — BP 130/80 | HR 92 | Ht 69.0 in | Wt 220.5 lb

## 2016-07-31 DIAGNOSIS — G8929 Other chronic pain: Secondary | ICD-10-CM

## 2016-07-31 DIAGNOSIS — M5441 Lumbago with sciatica, right side: Secondary | ICD-10-CM

## 2016-07-31 DIAGNOSIS — E538 Deficiency of other specified B group vitamins: Secondary | ICD-10-CM

## 2016-07-31 DIAGNOSIS — G609 Hereditary and idiopathic neuropathy, unspecified: Secondary | ICD-10-CM | POA: Diagnosis not present

## 2016-07-31 HISTORY — DX: Hereditary and idiopathic neuropathy, unspecified: G60.9

## 2016-07-31 MED ORDER — DULOXETINE HCL 30 MG PO CPEP: 30.0000 mg | ORAL_CAPSULE | Freq: Two times a day (BID) | ORAL | 3 refills | Status: DC

## 2016-07-31 NOTE — Patient Instructions (Signed)
   With the cymbalta 30 mg tablet, begin taking one a day for 2 weeks, then go to one twice a day.

## 2016-07-31 NOTE — Progress Notes (Signed)
Reason for visit: Peripheral neuropathy  Referring physician: Dr. Eliezer Champagne is a 71 y.o. female  History of present illness:  Susan Frye is a 71 year old white female with a history of chronic low back pain and leg discomfort that dates back to around 1985. The patient has slowly gotten worse with these issues over time, she has been seen by Dr. Arnoldo Morale from neurosurgery but she was told that she was not a good surgical candidate secondary to severe COPD. The patient has had a significant increase in her discomfort over the last 6 years. The patient has been taking hydrocodone for the discomfort, she has also been told that she has a peripheral neuropathy. MRI of the low back has shown multilevel degenerative changes and some spinal stenosis at the L4-5 level. The patient indicates that when she walks she will begin to have pain after about 10 or 15 minutes, with some discomfort going down the right leg from the knee to the foot. When she sits down the pain will dissipate. The patient also has discomfort in both feet in the evening hours, she takes gabapentin 600 mg at night for this. She cannot take the medication during the day secondary to drowsiness. She also reports neck pain without radiation down the arms. She denies any issues controlling the bowels or the bladder. She has weakness in both feet, unable to wiggle the toes on either side. She has been using a walker for over the last 3 years. She has had multiple epidural steroid injections without benefit. She is sent to this office for further evaluation. She claims that she has had EMG and nerve conduction study done sometime in the last 2 years, she cannot remember where this study was done but she was told that there was significant nerve damage in both legs. She carries the diagnosis of peripheral neuropathy.  Past Medical History:  Diagnosis Date  . Arthritis   . Cancer ([place])    skin  . COPD (chronic obstructive  pulmonary disease) ([place])   . Depression   . 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 Hypertension   . Spinal stenosis     Past Surgical History:  Procedure Laterality Date  . COSMETIC SURGERY     eye cosmetic surgery    Family History  Problem Relation Age of Onset  . Emphysema Mother     smoked  . Lung cancer Mother     smoked  . Varicose Veins Mother   . Emphysema Father     smoked  . Heart disease Father   . Diabetes Father   . Hyperlipidemia Father   . Hypertension Father   . Heart attack Father   . Cancer - Prostate Father   . Asthma Sister   . Diabetes Sister   . Heart disease Sister   . Diabetes Brother   . Hypertension Brother   . Heart attack Daughter   . Cancer Son     Social history:  reports that she quit smoking about 12 years ago. Her smoking use included Cigarettes. She has a 78.00 pack-year smoking history. She has never used smokeless tobacco. She reports that she drinks about 2.4 oz of alcohol per week . She reports that she does not use drugs.  Medications:  Prior to Admission medications   Medication Sig Start Date End Date Taking? Authorizing Provider  albuterol (PROVENTIL HFA;VENTOLIN HFA) 108 (90 BASE) MCG/ACT inhaler Inhale 2 puffs into the lungs  every 6 (six) hours as needed for wheezing or shortness of breath.   Yes Historical Provider, MD  Cholecalciferol (VITAMIN D3) 5000 units CAPS Take 5,000 Units by mouth daily.   Yes Historical Provider, MD  gabapentin (NEURONTIN) 300 MG capsule Take 600 mg by mouth at bedtime.   Yes Historical Provider, MD  hydrochlorothiazide (HYDRODIURIL) 25 MG tablet Take 25 mg by mouth daily.   Yes Historical Provider, MD  HYDROcodone-acetaminophen (NORCO) 10-325 MG tablet Take 1 tablet by mouth 3 (three) times daily as needed. 12/27/15  Yes Historical Provider, MD  ipratropium-albuterol (DUONEB) 0.5-2.5 (3) MG/3ML SOLN Take 3 mLs by nebulization 3 (three) times daily. Dx; copd  j44.9 Patient  taking differently: Take 3 mLs by nebulization 3 (three) times daily as needed. Dx; copd  j44.9 06/29/15  Yes Florencia Reasons, MD  LORazepam (ATIVAN) 0.5 MG tablet Take 2 tablets by mouth at bedtime.   Yes Historical Provider, MD  sertraline (ZOLOFT) 50 MG tablet Take 75 mg by mouth daily.    Yes Historical Provider, MD  umeclidinium-vilanterol (ANORO ELLIPTA) 62.5-25 MCG/INH AEPB Inhale 1 puff into the lungs daily. 03/20/16  Yes Rigoberto Noel, MD  vitamin B-12 (CYANOCOBALAMIN) 1000 MCG tablet Take 1,000 mcg by mouth daily.   Yes Historical Provider, MD  DULoxetine (CYMBALTA) 30 MG capsule Take 1 capsule (30 mg total) by mouth 2 (two) times daily. 07/31/16   Kathrynn Ducking, MD      Allergies  Allergen Reactions  . Ace Inhibitors     Cough    ROS:  Out of a complete 14 system review of symptoms, the patient complains only of the following symptoms, and all other reviewed systems are negative.  Shortness of breath Easy bruising Achy muscles Numbness, weakness Depression, not enough sleep Insomnia  Blood pressure 130/80, pulse 92, height 5\' 9"  (1.753 m), weight 220 lb 8 oz (100 kg).  Physical Exam  General: The patient is alert and cooperative at the time of the examination.  Eyes: Pupils are equal, round, and reactive to light. Discs are flat bilaterally.  Neck: The neck is supple, no carotid bruits are noted.  Respiratory: The respiratory examination is clear.  Cardiovascular: The cardiovascular examination reveals a regular rate and rhythm, no obvious murmurs or rubs are noted.  Skin: Extremities are without significant edema.  Neurologic Exam  Mental status: The patient is alert and oriented x 3 at the time of the examination. The patient has apparent normal recent and remote memory, with an apparently normal attention span and concentration ability.  Cranial nerves: Facial symmetry is present. There is good sensation of the face to pinprick and soft touch bilaterally. The  strength of the facial muscles and the muscles to head turning and shoulder shrug are normal bilaterally. Speech is well enunciated, no aphasia or dysarthria is noted. Extraocular movements are full. Visual fields are full. The tongue is midline, and the patient has symmetric elevation of the soft palate. No obvious hearing deficits are noted.  Motor: The motor testing reveals 5 over 5 strength of all 4 extremities, with exception of bilateral foot drops, right much more significant than the left. Good symmetric motor tone is noted throughout.  Sensory: Sensory testing is intact to pinprick, soft touch, vibration sensation, and position sense on the upper extremities. With the lower extremities, the patient has a stocking pattern pinprick sensory deficit one half way up the legs bilaterally with impairment of vibration and position sense is more prominent on the right  foot than the left. No evidence of extinction is noted.  Coordination: Cerebellar testing reveals good finger-nose-finger and heel-to-shin bilaterally.  Gait and station: Gait is slightly wide-based, the patient is able to ambulate without assistance, usually uses a walker. Tandem gait is unsteady. Romberg is negative. No drift is seen.  Reflexes: Deep tendon reflexes are symmetric, but are depressed bilaterally. Toes are downgoing bilaterally.   Assessment/Plan:  1. Chronic low back pain, right leg pain  2. Peripheral neuropathy by history  3. Gait disturbance  The patient appears to have issues that are consistent with spinal stenosis with pain in right leg pain with walking, alleviated by resting or sitting down. The patient also has peripheral neuropathy discomfort, this is not completely alleviated by the use of gabapentin. Epidural steroid injections of the low back previously were not helpful. The patient will be given a trial on Cymbalta, gradually going up on the dose. She will follow-up in 3 or 4 months. Blood work will  be done today. The patient cannot recall where the EMG and nerve conduction study was done previously.  Jill Alexanders MD 07/31/2016 3:30 PM  [facility] [address] [place], [place] [address]  Phone [phone] Fax [phone]

## 2016-08-02 ENCOUNTER — Telehealth: Payer: Self-pay

## 2016-08-02 LAB — MULTIPLE MYELOMA PANEL, SERUM
ALBUMIN SERPL ELPH-MCNC: 3.7 g/dL (ref 2.9–4.4)
ALPHA 1: 0.3 g/dL (ref 0.0–0.4)
ALPHA2 GLOB SERPL ELPH-MCNC: 0.8 g/dL (ref 0.4–1.0)
Albumin/Glob SerPl: 1.2 (ref 0.7–1.7)
B-GLOBULIN SERPL ELPH-MCNC: 1.1 g/dL (ref 0.7–1.3)
GLOBULIN, TOTAL: 3.2 g/dL (ref 2.2–3.9)
Gamma Glob SerPl Elph-Mcnc: 1 g/dL (ref 0.4–1.8)
IGG (IMMUNOGLOBIN G), SERUM: 852 mg/dL (ref 700–1600)
IgA/Immunoglobulin A, Serum: 285 mg/dL (ref 64–422)
IgM (Immunoglobulin M), Srm: 155 mg/dL (ref 26–217)
TOTAL PROTEIN: 6.9 g/dL (ref 6.0–8.5)

## 2016-08-02 LAB — ANA W/REFLEX: ANA: NEGATIVE

## 2016-08-02 LAB — RHEUMATOID FACTOR: RHEUMATOID FACTOR: 14.6 [IU]/mL — AB (ref 0.0–13.9)

## 2016-08-02 LAB — VITAMIN B12: Vitamin B-12: 720 pg/mL (ref 232–1245)

## 2016-08-02 LAB — ANGIOTENSIN CONVERTING ENZYME: Angio Convert Enzyme: 24 U/L (ref 14–82)

## 2016-08-02 LAB — B. BURGDORFI ANTIBODIES: Lyme IgG/IgM Ab: <0.91 {ISR} (ref 0.00–0.90)

## 2016-08-02 NOTE — Telephone Encounter (Signed)
-----   Message from Kathrynn Ducking, MD sent at 08/02/2016  1:41 PM EST ----- Blood work is unremarkable with exception of a very minimal elevation in the rheumatoid factor, not likely to be clinically significant. Please call the patient. ----- Message ----- From: Lavone Neri Lab Results In Sent: 08/01/2016   7:41 AM To: Kathrynn Ducking, MD

## 2016-08-02 NOTE — Telephone Encounter (Signed)
Called pt w/ lab results. May call back w/ additional questions/concerns. 

## 2016-08-08 ENCOUNTER — Ambulatory Visit (INDEPENDENT_AMBULATORY_CARE_PROVIDER_SITE_OTHER)
Admission: RE | Admit: 2016-08-08 | Discharge: 2016-08-08 | Disposition: A | Discharge status: Home / Self Care | Payer: Medicare Other | Source: Ambulatory Visit | Attending: Acute Care | Admitting: Acute Care

## 2016-08-08 ENCOUNTER — Ambulatory Visit (INDEPENDENT_AMBULATORY_CARE_PROVIDER_SITE_OTHER): Payer: Medicare Other | Admitting: Pulmonary Disease

## 2016-08-08 ENCOUNTER — Encounter: Payer: Self-pay | Admitting: Pulmonary Disease

## 2016-08-08 DIAGNOSIS — R911 Solitary pulmonary nodule: Secondary | ICD-10-CM | POA: Diagnosis not present

## 2016-08-08 DIAGNOSIS — Z87891 Personal history of nicotine dependence: Secondary | ICD-10-CM

## 2016-08-08 DIAGNOSIS — R918 Other nonspecific abnormal finding of lung field: Secondary | ICD-10-CM | POA: Diagnosis not present

## 2016-08-08 DIAGNOSIS — J449 Chronic obstructive pulmonary disease, unspecified: Secondary | ICD-10-CM

## 2016-08-08 NOTE — Patient Instructions (Signed)
Repeat CT scan of the chest today Stay on Anoro

## 2016-08-08 NOTE — Assessment & Plan Note (Signed)
Stay on Anoro 

## 2016-08-08 NOTE — Assessment & Plan Note (Signed)
Repeat CT scan of the chest today Note that prior CT also showed old granulomatous disease

## 2016-08-08 NOTE — Progress Notes (Signed)
   Subjective:    Patient ID: Susan Frye, female    DOB: 25-Feb-1946, 71 y.o.   MRN: [id number]  HPI   50 yowf with gold C COPD ,quit smoking 03/2004  for followup of dyspnea.  Her husband passed away in Oct 11, 2012 after a stroke and she reports mild depression since then. She lost significant weight from 256 pounds in the last 2 years intentionally.  She reports chronic back pain due to spinal stenosis.  08/08/2016  Chief Complaint  Patient presents with  . Follow-up    4 month follow up. Breathing has been ok for the past few months.    She has chronic back pain and ambulates with a  Walker She treated and her simply go for a portable concentrator-but would now like to change back. She never started on a pulmonary rehabilitation program due to long travel distance. Breathing has been stable, she is compliant with oxygen and this seems to have helped-her O2 saturation is 94% today at rest but drops to 83% on exertion She is compliant with anoro  We reviewed her low-dose screening CT scan which was RADS 4a and a repeat scan is scheduled for today   Significant tests/ events PFT's 12/10/03 FEV1 1.12 (36%) ratio 49  - PFT's 05/31/2011 1.31 (46%) ratio 50 and 13% p B2 with DLC0 40%  Spirometry 04/2013 - unchanged FEV1 at 1.28-44% and FVC of 2.09-53% with ratio 61.   Low risk myocardial perfusion scan 2014   12/2013 Acute OV ,D-dimer negative, CT angio neg  Could not tell a difference with breo - spiriva helped  CT screen 03/2016 >> RADS 4A, LLL nodule, old granulomatous disease - calcified hilar LNs + spleen      Review of Systems Patient denies significant dyspnea,cough, hemoptysis,  chest pain, palpitations, pedal edema, orthopnea, paroxysmal nocturnal dyspnea, lightheadedness, nausea, vomiting, abdominal or  leg pains       Objective:   Physical Exam  Gen. Pleasant, obese, in no distress ENT - no lesions, no post nasal drip Neck: No JVD, no thyromegaly, no  carotid bruits Lungs: no use of accessory muscles, no dullness to percussion, decreased without rales or rhonchi  Cardiovascular: Rhythm regular, heart sounds  normal, no murmurs or gallops, no peripheral edema Musculoskeletal: No deformities, no cyanosis or clubbing , no tremors       Assessment & Plan:

## 2016-08-25 ENCOUNTER — Telehealth: Payer: Self-pay | Admitting: Acute Care

## 2016-08-25 DIAGNOSIS — Z87891 Personal history of nicotine dependence: Secondary | ICD-10-CM

## 2016-08-25 NOTE — Telephone Encounter (Signed)
I have called Mrs. Susan Frye been with the results of her low-dose screening CT. I explained that her scan was read as a lung RADS 2, indicating nodules that are benign in appearance or behavior. Recommendation is for continued annual screening with low-dose chest CT in 12 months. I explained that we will order and schedule scan for January 2019. I explained that there was an additional finding of aortic atherosclerosis and coronary artery calcification. I explained that as this is a non-gated exam degree or severity cannot be determined. She states she is currently not having her cholesterol checked by her primary care physician, or taking a statin. I told her I will fax a copy of the results to her primary care physician I have asked her to follow-up with him regarding treatment of her coronary artery disease. She verbalized understanding of the above and had no further questions.

## 2016-09-19 ENCOUNTER — Telehealth: Payer: Self-pay | Admitting: Acute Care

## 2016-09-19 NOTE — Telephone Encounter (Signed)
Will hold in triage to call back after 4pm

## 2016-09-20 NOTE — Telephone Encounter (Signed)
Attempted to contact pt. No answer, no option to leave a message. Will try back.  

## 2016-09-20 NOTE — Telephone Encounter (Signed)
Spoke with the pt  She is asking if we can send her CT Chest to [facility], [place] ([phone]) I have done so and pt aware  Nothing further needed

## 2016-10-16 ENCOUNTER — Telehealth: Payer: Self-pay | Admitting: Pulmonary Disease

## 2016-10-16 MED ORDER — PREDNISONE 10 MG PO TABS: ORAL_TABLET | ORAL | 0 refills | Status: DC

## 2016-10-16 NOTE — Telephone Encounter (Signed)
Call in pred taper. Starting at 40 mg. Reduce dose by 10 mg every 3 days.

## 2016-10-16 NOTE — Telephone Encounter (Signed)
Pt aware of recs.  rx called in to preferred pharmacy.  Nothing further needed.

## 2016-10-16 NOTE — Telephone Encounter (Signed)
Dr. Vaughan Browner  Please Advise-Sick Message  Pt called in c/o she has been having trouble with her breathing, she feels like she is experiencing increase sob. Denies fever,wheezing, coughing. She is requesting prednisone being called in.    Allergies  Allergen Reactions  . Ace Inhibitors     Cough

## 2016-11-28 ENCOUNTER — Ambulatory Visit: Payer: Medicare Other | Admitting: Adult Health

## 2016-12-01 ENCOUNTER — Ambulatory Visit (INDEPENDENT_AMBULATORY_CARE_PROVIDER_SITE_OTHER): Payer: Medicare Other | Admitting: Adult Health

## 2016-12-01 ENCOUNTER — Encounter: Payer: Self-pay | Admitting: Adult Health

## 2016-12-01 DIAGNOSIS — F329 Major depressive disorder, single episode, unspecified: Secondary | ICD-10-CM

## 2016-12-01 DIAGNOSIS — F32A Depression, unspecified: Secondary | ICD-10-CM

## 2016-12-01 DIAGNOSIS — J449 Chronic obstructive pulmonary disease, unspecified: Secondary | ICD-10-CM

## 2016-12-01 DIAGNOSIS — J9611 Chronic respiratory failure with hypoxia: Secondary | ICD-10-CM | POA: Diagnosis not present

## 2016-12-01 DIAGNOSIS — R0600 Dyspnea, unspecified: Secondary | ICD-10-CM

## 2016-12-01 NOTE — Assessment & Plan Note (Signed)
Follow up with PCP and counselor  Support provided No suicidal ideations.

## 2016-12-01 NOTE — Assessment & Plan Note (Signed)
Encouraged on oxygen compliance  PLAN Wear oxygen 2 L with activity and bedtime.

## 2016-12-01 NOTE — Progress Notes (Signed)
@Patient  ID: Susan Frye, female    DOB: [birth year], 71 y.o.   MRN: [id number]  Chief Complaint  Patient presents with  . Acute Visit    COPD     Referring provider: Christain Sacramento, MD  HPI: 71 year old female former smoker followed for Gold C COPD Has chronic back pain due to spinal stenosis  TEST  PFT's 12/10/03 FEV1 1.12 (36%) ratio 49  - PFT's 05/31/2011 1.31 (46%) ratio 50 and 13% p B2 with DLC0 40%  Spirometry 04/2013 - unchanged FEV1 at 1.28-44% and FVC of 2.09-53% with ratio 61.   Low risk myocardial perfusion scan 2014   12/2013 Acute OV ,D-dimer negative, CT angio neg  Could not tell a difference with breo - spiriva helped  CT screen 03/2016 >> RADS 4A, LLL nodule, old granulomatous disease - calcified hilar LNs + spleen CT chest 07/2016 benign appearance. Recheck in 12 months .    12/01/2016 Acute OV : COPD  Pt presents for an acute office visit. She says she thinks she had a panic attack 3 days ago . Was worrying about a lot of stuff last night with increased sob. She had tightness and heart racing.  EMS was called , told her EKG was ok. She did not want to go to ER . She did go by private vehicle but wait was too long and was not seen.  She did not have chest pain but tightness . She has had no further episodes . She denies chest pain or exertional chest pain. No increased cough or congestion .  No fever or edema.  She has seen a cardiologist in past, stress test in 2014 w/ low risk study .  FH of CAD , Father MI at 110 , died at age 75 from CVA .   She remains on ANORO daily.  She is supposed to be on oxygen at 2 L but does not wear it. O2 saturations were 88% on arrival to the office today on room air. Patient encouraged on oxygen compliance.    She says she is depressed . She is sad that so many of her family and friends are passing away.  She is followed with her PCP , on meds. Denies suicidal ideations. Has appointment with counselor 2 weeks.  Support  provided.     Allergies  Allergen Reactions  . Ace Inhibitors     Cough    Immunization History  Administered Date(s) Administered  . Influenza,inj,Quad PF,36+ Mos 04/24/2013, 04/24/2014, 06/23/2015, 03/20/2016  . Pneumococcal Conjugate-13 05/24/2014  . Zoster 05/24/2014    Past Medical History:  Diagnosis Date  . Arthritis   . Cancer ([place])    skin  . COPD (chronic obstructive pulmonary disease) ([place])   . Depression   . 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 Hypertension   . Spinal stenosis     Tobacco History: History  Smoking Status  . Former Smoker  . Packs/day: 2.00  . Years: 39.00  . Types: Cigarettes  . Quit date: 04/03/2004  Smokeless Tobacco  . Never Used   Counseling given: Not Answered   Outpatient Encounter Prescriptions as of 12/01/2016  Medication Sig  . albuterol (PROVENTIL HFA;VENTOLIN HFA) 108 (90 BASE) MCG/ACT inhaler Inhale 2 puffs into the lungs every 6 (six) hours as needed for wheezing or shortness of breath.  . Cholecalciferol (VITAMIN D3) 5000 units CAPS Take 5,000 Units by mouth daily.  Marland Kitchen gabapentin (NEURONTIN) 300 MG capsule Take 600 mg by  mouth at bedtime.  . hydrochlorothiazide (HYDRODIURIL) 25 MG tablet Take 25 mg by mouth daily.  Marland Kitchen HYDROcodone-acetaminophen (NORCO) 10-325 MG tablet Take 1 tablet by mouth 3 (three) times daily as needed.  Marland Kitchen ipratropium-albuterol (DUONEB) 0.5-2.5 (3) MG/3ML SOLN Take 3 mLs by nebulization 3 (three) times daily. Dx; copd  j44.9 (Patient taking differently: Take 3 mLs by nebulization 3 (three) times daily as needed. Dx; copd  j44.9)  . LORazepam (ATIVAN) 0.5 MG tablet Take 2 tablets by mouth at bedtime.  . predniSONE (DELTASONE) 10 MG tablet 40mg X3 days, 30mg  X3 days, 20mg  X3 days, 10mg X3 days, then stop.  Marland Kitchen sertraline (ZOLOFT) 50 MG tablet Take 75 mg by mouth daily.   Marland Kitchen umeclidinium-vilanterol (ANORO ELLIPTA) 62.5-25 MCG/INH AEPB Inhale 1 puff into the lungs daily.  . vitamin B-12  (CYANOCOBALAMIN) 1000 MCG tablet Take 1,000 mcg by mouth daily.  . [DISCONTINUED] DULoxetine (CYMBALTA) 30 MG capsule Take 1 capsule (30 mg total) by mouth 2 (two) times daily.   No facility-administered encounter medications on file as of 12/01/2016.      Review of Systems  Constitutional:   No  weight loss, night sweats,  Fevers, chills, fatigue, or  lassitude.  HEENT:   No headaches,  Difficulty swallowing,  Tooth/dental problems, or  Sore throat,                No sneezing, itching, ear ache, nasal congestion, post nasal drip,   CV:  No chest pain,  Orthopnea, PND, swelling in lower extremities, anasarca, dizziness, palpitations, syncope.   GI  No heartburn, indigestion, abdominal pain, nausea, vomiting, diarrhea, change in bowel habits, loss of appetite, bloody stools.   Resp:  .  No excess mucus, no productive cough,  No non-productive cough,  No coughing up of blood.  No change in color of mucus.  No wheezing.  No chest wall deformity  Skin: no rash or lesions.  GU: no dysuria, change in color of urine, no urgency or frequency.  No flank pain, no hematuria   MS:  No joint pain or swelling.  No decreased range of motion.  No back pain.    Physical Exam  BP 122/66 (BP Location: Left Arm, Cuff Size: Normal)   Pulse 96   Ht 6' (1.829 m)   Wt 222 lb (100.7 kg)   SpO2 (!) 88%   BMI 30.11 kg/m   GEN: A/Ox3; pleasant , NAD, well nourished    HEENT:  [place]/AT,  EACs-clear, TMs-wnl, NOSE-clear, THROAT-clear, no lesions, no postnasal drip or exudate noted.   NECK:  Supple w/ fair ROM; no JVD; normal carotid impulses w/o bruits; no thyromegaly or nodules palpated; no lymphadenopathy.    RESP  Decreased BS in bases . no accessory muscle use, no dullness to percussion  CARD:  RRR, no m/r/g, no peripheral edema, pulses intact, no cyanosis or clubbing.  GI:   Soft & nt; nml bowel sounds; no organomegaly or masses detected.   Musco: Warm bil, no deformities or joint swelling  noted.   Neuro: alert, no focal deficits noted.    Skin: Warm, no lesions or rashes    Lab Results:  Imaging: No results found.   Assessment & Plan:   COPD GOLD III Appears stable without exacerbation  Plan  Cont on ANORO .   Chronic respiratory failure with hypoxia (HCC) Encouraged on oxygen compliance  PLAN Wear oxygen 2 L with activity and bedtime.   Depression Follow up with PCP and counselor  Support  provided No suicidal ideations.   Dyspnea Episode of dyspnea, tightness and palpitations 3 days ago ? Etiology , told EKG ok per EMS  Could of been panic attack but emphasized to her unsure what it was . Should see cards to make sure no further testing indicated  Plan  Patient Instructions  Please make an appointment with your cardiologist .  If you develop chest tightness , you need to seek emergency care  Keep follow up with Counselor as planned .  Continue on ANORO daily , rinse after use.  Wear Oxygen 2l/m with activity and At bedtime   Follow up Dr. Elsworth Soho  In 3 months and As needed          Rexene Edison, NP 12/01/2016

## 2016-12-01 NOTE — Patient Instructions (Signed)
Please make an appointment with your cardiologist .  If you develop chest tightness , you need to seek emergency care  Keep follow up with Counselor as planned .  Continue on ANORO daily , rinse after use.  Wear Oxygen 2l/m with activity and At bedtime   Follow up Dr. Elsworth Soho  In 3 months and As needed

## 2016-12-01 NOTE — Assessment & Plan Note (Signed)
Episode of dyspnea, tightness and palpitations 3 days ago ? Etiology , told EKG ok per EMS  Could of been panic attack but emphasized to her unsure what it was . Should see cards to make sure no further testing indicated  Plan  Patient Instructions  Please make an appointment with your cardiologist .  If you develop chest tightness , you need to seek emergency care  Keep follow up with Counselor as planned .  Continue on ANORO daily , rinse after use.  Wear Oxygen 2l/m with activity and At bedtime   Follow up Dr. Elsworth Soho  In 3 months and As needed

## 2016-12-01 NOTE — Assessment & Plan Note (Signed)
Appears stable without exacerbation  Plan  Cont on ANORO .

## 2016-12-13 ENCOUNTER — Ambulatory Visit (INDEPENDENT_AMBULATORY_CARE_PROVIDER_SITE_OTHER): Payer: Medicare Other | Admitting: Clinical

## 2016-12-13 ENCOUNTER — Encounter (HOSPITAL_COMMUNITY): Payer: Self-pay | Admitting: Clinical

## 2016-12-13 DIAGNOSIS — F331 Major depressive disorder, recurrent, moderate: Secondary | ICD-10-CM | POA: Diagnosis not present

## 2016-12-13 DIAGNOSIS — F411 Generalized anxiety disorder: Secondary | ICD-10-CM | POA: Diagnosis not present
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
## 2016-12-14 ENCOUNTER — Encounter: Payer: Self-pay | Admitting: Cardiovascular Disease

## 2016-12-14 ENCOUNTER — Ambulatory Visit (INDEPENDENT_AMBULATORY_CARE_PROVIDER_SITE_OTHER): Payer: Medicare Other | Admitting: Cardiovascular Disease

## 2016-12-14 DIAGNOSIS — I10 Essential (primary) hypertension: Secondary | ICD-10-CM

## 2016-12-14 DIAGNOSIS — R0789 Other chest pain: Secondary | ICD-10-CM | POA: Diagnosis not present

## 2016-12-14 NOTE — Progress Notes (Signed)
12/14/2016 Bryson Dames [name]   26-Jul-1945  [id number]  Primary Physician Christain Sacramento, MD Primary Cardiologist: Lorretta Harp MD Lupe Carney, [place]  HPI:   Ms Clyne is a 71 year old mildly overweight widowed Caucasian female mother of 2, her mother to grandchildren referred by Dr. Elsworth Soho for cardiovascular evaluation because of 2 episodes of atypical chest pain. Her factors include 60-pack-years of tobacco abuse having quit 13 years ago with COPD requiring nighttime O2. She has treated hypertension. She's never had a heart attack or stroke. There is a strong family history for heart disease. She is under a lot of stress at home apparently has had 2 panic attacks associated with substernal chest pain.   Current Outpatient Prescriptions  Medication Sig Dispense Refill  . albuterol (PROVENTIL HFA;VENTOLIN HFA) 108 (90 BASE) MCG/ACT inhaler Inhale 2 puffs into the lungs every 6 (six) hours as needed for wheezing or shortness of breath.    . Cholecalciferol (VITAMIN D3) 5000 units CAPS Take 5,000 Units by mouth daily.    Marland Kitchen gabapentin (NEURONTIN) 300 MG capsule Take 600 mg by mouth at bedtime.    . hydrochlorothiazide (HYDRODIURIL) 25 MG tablet Take 25 mg by mouth daily.    Marland Kitchen HYDROcodone-acetaminophen (NORCO) 10-325 MG tablet Take 1 tablet by mouth 3 (three) times daily as needed.    Marland Kitchen ipratropium-albuterol (DUONEB) 0.5-2.5 (3) MG/3ML SOLN Take 3 mLs by nebulization 3 (three) times daily. Dx; copd  j44.9 (Patient taking differently: Take 3 mLs by nebulization 3 (three) times daily as needed. Dx; copd  j44.9) 360 mL 0  . LORazepam (ATIVAN) 0.5 MG tablet Take 2 tablets by mouth at bedtime.    . predniSONE (DELTASONE) 10 MG tablet 40mg X3 days, 30mg  X3 days, 20mg  X3 days, 10mg X3 days, then stop. 30 tablet 0  . umeclidinium-vilanterol (ANORO ELLIPTA) 62.5-25 MCG/INH AEPB Inhale 1 puff into the lungs daily. 60 each 5  . vitamin B-12 (CYANOCOBALAMIN) 1000 MCG tablet Take 1,000 mcg by mouth  daily.     No current facility-administered medications for this visit.     Allergies  Allergen Reactions  . Ace Inhibitors     Cough    Social History   Social History  . Marital status: Widowed    Spouse name: N/A  . Number of children: 2  . Years of education: 14   Occupational History  . Retired     Management   Social History Main Topics  . Smoking status: Former Smoker    Packs/day: 2.00    Years: 39.00    Types: Cigarettes    Quit date: 04/03/2004  . Smokeless tobacco: Never Used  . Alcohol use 2.4 oz/week    4 Cans of beer per week     Comment: Social  . Drug use: No  . Sexual activity: Not on file   Other Topics Concern  . Not on file   Social History Narrative   Lives at home, widowed   Right-handed   Caffeine: 4 cups per day     Review of Systems: General: negative for chills, fever, night sweats or weight changes.  Cardiovascular: negative for chest pain, dyspnea on exertion, edema, orthopnea, palpitations, paroxysmal nocturnal dyspnea or shortness of breath Dermatological: negative for rash Respiratory: negative for cough or wheezing Urologic: negative for hematuria Abdominal: negative for nausea, vomiting, diarrhea, bright red blood per rectum, melena, or hematemesis Neurologic: negative for visual changes, syncope, or dizziness All other systems reviewed and are otherwise negative except  as noted above.    Blood pressure 110/60, pulse 99, height 5\' 11"  (1.803 m), weight 220 lb (99.8 kg).  General appearance: alert and no distress Neck: no adenopathy, no carotid bruit, no JVD, supple, symmetrical, trachea midline and thyroid not enlarged, symmetric, no tenderness/mass/nodules Lungs: clear to auscultation bilaterally Heart: regular rate and rhythm, S1, S2 normal, no murmur, click, rub or gallop Extremities: extremities normal, atraumatic, no cyanosis or edema  EKG sinus rhythm at 99 without ST or T-wave changes. I personally reviewed this  EKG  ASSESSMENT AND PLAN:   Atypical chest pain Ms. Raffo was referred to me by Dr. Elsworth Soho for evaluation of atypical chest pain. Her factors include a long history of tobacco abuse having quit 13 years ago, treated hypertension and a strong family history of heart disease. She's been under a lot of stress recently and has had 2 panic attacks associated with chest pain. Will order a pharmacologic Myoview stress test to rule out an ischemic etiology.  Essential hypertension History of essential hypertension with blood pressure measured today at 110/60. She is on hydrochlorothiazide. Continue current meds at current dosing.      Lorretta Harp MD FACP,FACC,FAHA, [facility] 12/14/2016 4:35 PM

## 2016-12-14 NOTE — Assessment & Plan Note (Signed)
History of essential hypertension with blood pressure measured today at 110/60. She is on hydrochlorothiazide. Continue current meds at current dosing.

## 2016-12-14 NOTE — Assessment & Plan Note (Signed)
Susan Frye was referred to me by Dr. Elsworth Soho for evaluation of atypical chest pain. Her factors include a long history of tobacco abuse having quit 13 years ago, treated hypertension and a strong family history of heart disease. She's been under a lot of stress recently and has had 2 panic attacks associated with chest pain. Will order a pharmacologic Myoview stress test to rule out an ischemic etiology.

## 2016-12-14 NOTE — Patient Instructions (Signed)
Medication Instructions: Your physician recommends that you continue on your current medications as directed. Please refer to the Current Medication list given to you today.  Testing/Procedures: Your physician has requested that you have a lexiscan myoview. For further information please visit [email or web address]. Please follow instruction sheet, as given.  Follow-Up: Your physician recommends that you schedule a follow-up appointment as needed with Dr. Gwenlyn Found.   Any Other Special Instructions will be listed below:  Pharmacologic Stress Electrocardiogram Introduction A pharmacologic stress electrocardiogram is a heart (cardiac) test that uses nuclear imaging to evaluate the blood supply to your heart. This test may also be called a pharmacologic stress electrocardiography. Pharmacologic means that a medicine is used to increase your heart rate and blood pressure. This stress test is done to find areas of poor blood flow to the heart by determining the extent of coronary artery disease (CAD). Some people exercise on a treadmill, which naturally increases the blood flow to the heart. For those people unable to exercise on a treadmill, a medicine is used. This medicine stimulates your heart and will cause your heart to beat harder and more quickly, as if you were exercising. Pharmacologic stress tests can help determine:  The adequacy of blood flow to your heart during increased levels of activity in order to clear you for discharge home.  The extent of coronary artery blockage caused by CAD.  Your prognosis if you have suffered a heart attack.  The effectiveness of cardiac procedures done, such as an angioplasty, which can increase the circulation in your coronary arteries.  Causes of chest pain or pressure. LET [facility] CARE PROVIDER KNOW ABOUT:  Any allergies you have.  All medicines you are taking, including vitamins, herbs, eye drops, creams, and over-the-counter  medicines.  Previous problems you or members of your family have had with the use of anesthetics.  Any blood disorders you have.  Previous surgeries you have had.  Medical conditions you have.  Possibility of pregnancy, if this applies.  If you are currently breastfeeding. RISKS AND COMPLICATIONS Generally, this is a safe procedure. However, as with any procedure, complications can occur. Possible complications include:  You develop pain or pressure in the following areas:  Chest.  Jaw or neck.  Between your shoulder blades.  Radiating down your left arm.  Headache.  Dizziness or light-headedness.  Shortness of breath.  Increased or irregular heartbeat.  Low blood pressure.  Nausea or vomiting.  Flushing.  Redness going up the arm and slight pain during injection of medicine.  Heart attack (rare). BEFORE THE PROCEDURE  Avoid all forms of caffeine for 24 hours before your test or as directed by your health care provider. This includes coffee, tea (even decaffeinated tea), caffeinated sodas, chocolate, cocoa, and certain pain medicines.  Follow your health care provider's instructions regarding eating and drinking before the test.  Take your medicines as directed at regular times with water unless instructed otherwise. Exceptions may include:  If you have diabetes, ask how you are to take your insulin or pills. It is common to adjust insulin dosing the morning of the test.  If you are taking beta-blocker medicines, it is important to talk to your health care provider about these medicines well before the date of your test. Taking beta-blocker medicines may interfere with the test. In some cases, these medicines need to be changed or stopped 24 hours or more before the test.  If you wear a nitroglycerin patch, it may need to be removed prior  to the test. Ask your health care provider if the patch should be removed before the test.  If you use an inhaler for any  breathing condition, bring it with you to the test.  If you are an outpatient, bring a snack so you can eat right after the stress phase of the test.  Do not smoke for 4 hours prior to the test or as directed by your health care provider.  Do not apply lotions, powders, creams, or oils on your chest prior to the test.  Wear comfortable shoes and clothing. Let your health care provider know if you were unable to complete or follow the preparations for your test. PROCEDURE  Multiple patches (electrodes) will be put on your chest. If needed, small areas of your chest may be shaved to get better contact with the electrodes. Once the electrodes are attached to your body, multiple wires will be attached to the electrodes, and your heart rate will be monitored.  An IV access will be started. A nuclear trace (isotope) is given. The isotope may be given intravenously, or it may be swallowed. Nuclear refers to several types of radioactive isotopes, and the nuclear isotope lights up the arteries so that the nuclear images are clear. The isotope is absorbed by your body. This results in low radiation exposure.  A resting nuclear image is taken to show how your heart functions at rest.  A medicine is given through the IV access.  A second scan is done about 1 hour after the medicine injection and determines how your heart functions under stress.  During this stress phase, you will be connected to an electrocardiogram machine. Your blood pressure and oxygen levels will be monitored. What to expect after the procedure  Your heart rate and blood pressure will be monitored after the test.  You may return to your normal schedule, including diet,activities, and medicines, unless your health care provider tells you otherwise. This information is not intended to replace advice given to you by your health care provider. Make sure you discuss any questions you have with your health care provider. Document  Released: 11/26/2008 Document Revised: 12/16/2015 Document Reviewed: 01/17/2016 Elsevier Interactive Patient Education  2017 [employer].    If you need a refill on your cardiac medications before your next appointment, please call your pharmacy.

## 2016-12-25 ENCOUNTER — Ambulatory Visit (INDEPENDENT_AMBULATORY_CARE_PROVIDER_SITE_OTHER): Payer: Medicare Other | Admitting: Psychiatry

## 2016-12-25 ENCOUNTER — Ambulatory Visit: Payer: Self-pay | Admitting: Cardiology

## 2016-12-25 ENCOUNTER — Encounter (HOSPITAL_COMMUNITY): Payer: Self-pay | Admitting: Psychiatry

## 2016-12-25 VITALS — BP 130/88 | HR 102 | Ht 72.0 in | Wt 221.6 lb

## 2016-12-25 DIAGNOSIS — Z87891 Personal history of nicotine dependence: Secondary | ICD-10-CM | POA: Diagnosis not present

## 2016-12-25 DIAGNOSIS — F331 Major depressive disorder, recurrent, moderate: Secondary | ICD-10-CM | POA: Diagnosis not present

## 2016-12-25 MED ORDER — MIRTAZAPINE 15 MG PO TABS: 15.0000 mg | ORAL_TABLET | Freq: Every day | ORAL | 0 refills | Status: DC

## 2016-12-25 MED ORDER — BUPROPION HCL ER (XL) 150 MG PO TB24: 300.0000 mg | ORAL_TABLET | Freq: Every day | ORAL | 0 refills | Status: DC

## 2016-12-25 NOTE — Progress Notes (Signed)
Psychiatric Initial Adult Assessment   Patient Identification: Susan Frye MRN:  [id number] Date of Evaluation:  12/25/2016 Referral Source: pcp Chief Complaint:  depression Visit Diagnosis:    ICD-9-CM ICD-10-CM   1. 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  She is been retired for about 7-8 years, and has been able to spend time with her husband. She reports that her husband of 79 years passed away in 10-Nov-2013. She reports that this was followed over the next 2 years by her sister and brother also passing away. She reports that earlier this year, her daughter's husband died of liver cancer.  This onslaught of tragedies has been a contributing factor to the patient becoming more depressed over the past year. She reports that it is also been difficult to have any consistent socialization, given that she is no longer working. She has 3 long-term friends that she's been friends with for about 29 years in [place], but again all of their health makes it difficult to meet up as often as she would like.  She has COPD and struggles with arthritis, requires a walker.  She reports that she moved from [place] to the [place] area to be closer to her son, but she reports that this is made her feel more isolated. She does visit with her son fairly often, and her great grandchild who is a baby and lives with her son. She reports she feels like a burden at times, and she does not want to impose on her son and his wife. She reports that when she was in [place] she was at least able to spend more time with her friends.  She reports that she has had difficulty having the energy and motivation to do things that she  likes, she often ruminates about how bad she feels, and how down and tired she feels. She has difficulty with concentrating. She also feels physically fatigued, but admits this is likely exacerbated by her COPD. She has had passive thoughts that maybe it would be easier if she was just hit by a truck, but she does not want to hurt herself. She worries about her grandson, and worries about her daughter especially in the light of her son-in-law's passing. She's not had any psychotic symptoms.  She reports that she has been tried on Cymbalta, and this made her dizzy. It had been added for her neuropathy in her feet. She reports that she was tried on Zoloft, and this made her feel very fatigued and funny in her head. She endorses a woozy and foggy-like sensation with Zoloft. She reports that she has been on Wellbutrin 150 mg for the past one month, and reports that she has not had any side effects, but still feels depressed. We agreed to increase to 300 mg XL daily, and I confirmed that she does not have any history of seizures.  Given her sleep difficulties, we agreed to initiate Remeron at 15 mg nightly. I discussed my concerns about her remaining on Ativan, and suggested that we taper the Ativan and discontinuing one week. In addition I suggested that she stopped taking the serotonin supplement, given that this could interact with her psychiatric  medications.  She agrees to follow-up in 8 weeks. She reports that she will be spending a couple weeks with her daughter, and may ultimately consider moving in [place], to be able to be a support to her daughter in the aftermath of the husbands death. She and her daughter have been thinking about this for a couple months, and they may even adopt a dog together.  Associated Signs/Symptoms: Depression Symptoms:  depressed mood, anhedonia, hypersomnia, psychomotor retardation, feelings of worthlessness/guilt, difficulty concentrating, hopelessness, recurrent  thoughts of death, anxiety, (Hypo) Manic Symptoms:  none Anxiety Symptoms:  Excessive Worry, Psychotic Symptoms:  none PTSD Symptoms: Negative  Past Psychiatric History: No prior psychiatric treatment or inpatient hospitalizations  Previous Psychotropic Medications: Yes   Substance Abuse History in the last 12 months:  No.  Consequences of Substance Abuse: Negative  Past Medical History:  Past Medical History:  Diagnosis Date  . Arthritis   . Cancer ([place])    skin  . COPD (chronic obstructive pulmonary disease) ([place])   . Depression   . 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 Hypertension   . Spinal stenosis     Past Surgical History:  Procedure Laterality Date  . COSMETIC SURGERY     eye cosmetic surgery    Family Psychiatric History: None  Family History:  Family History  Problem Relation Age of Onset  . Emphysema Mother        smoked  . Lung cancer Mother        smoked  . Varicose Veins Mother   . Emphysema Father        smoked  . Heart disease Father   . Diabetes Father   . Hyperlipidemia Father   . Hypertension Father   . Heart attack Father   . Cancer - Prostate Father   . Asthma Sister   . Diabetes Sister   . Heart disease Sister   . Diabetes Brother   . Hypertension Brother   . Heart attack Daughter   . Cancer Son     Social History:   Social History   Social History  . Marital status: Widowed    Spouse name: N/A  . Number of children: 2  . Years of education: 14   Occupational History  . Retired     Management   Social History Main Topics  . Smoking status: Former Smoker    Packs/day: 2.00    Years: 39.00    Types: Cigarettes    Quit date: 04/03/2004  . Smokeless tobacco: Never Used  . Alcohol use 2.4 oz/week    4 Cans of beer per week     Comment: Social  . Drug use: No  . Sexual activity: Not Asked   Other Topics Concern  . None   Social History Narrative   Lives at home, widowed   Right-handed    Caffeine: 4 cups per day    Additional Social History: Retired, worked in the Company secretary for about 40 years  Allergies:   Allergies  Allergen Reactions  . Ace Inhibitors     Cough    Metabolic Disorder Labs: Lab Results  Component Value Date   HGBA1C 5.8 (H) 06/26/2015   MPG 120 06/26/2015   No results found for: PROLACTIN No results found for: CHOL, TRIG, HDL, CHOLHDL, VLDL, LDLCALC   Current Medications: Current Outpatient Prescriptions  Medication Sig Dispense Refill  . albuterol (PROVENTIL HFA;VENTOLIN HFA) 108 (90 BASE) MCG/ACT inhaler Inhale 2 puffs into the  lungs every 6 (six) hours as needed for wheezing or shortness of breath.    Marland Kitchen buPROPion (WELLBUTRIN XL) 150 MG 24 hr tablet Take 2 tablets (300 mg total) by mouth daily. 90 tablet 0  . Cholecalciferol (VITAMIN D3) 5000 units CAPS Take 5,000 Units by mouth daily.    Marland Kitchen gabapentin (NEURONTIN) 300 MG capsule Take 600 mg by mouth at bedtime.    . hydrochlorothiazide (HYDRODIURIL) 25 MG tablet Take 25 mg by mouth daily.    Marland Kitchen HYDROcodone-acetaminophen (NORCO) 10-325 MG tablet Take 1 tablet by mouth 3 (three) times daily as needed.    Marland Kitchen ipratropium-albuterol (DUONEB) 0.5-2.5 (3) MG/3ML SOLN Take 3 mLs by nebulization 3 (three) times daily. Dx; copd  j44.9 (Patient taking differently: Take 3 mLs by nebulization 3 (three) times daily as needed. Dx; copd  j44.9) 360 mL 0  . umeclidinium-vilanterol (ANORO ELLIPTA) 62.5-25 MCG/INH AEPB Inhale 1 puff into the lungs daily. 60 each 5  . mirtazapine (REMERON) 15 MG tablet Take 1 tablet (15 mg total) by mouth at bedtime. 90 tablet 0   No current facility-administered medications for this visit.     Neurologic: Headache: Negative Seizure: Negative Paresthesias:Negative  Musculoskeletal: Strength & Muscle Tone: within normal limits Gait & Station: normal Patient leans: N/A USING rolling walker with seat  Psychiatric Specialty Exam: Review of Systems   Constitutional: Negative.   Respiratory: Positive for shortness of breath.   Cardiovascular: Negative.   Gastrointestinal: Negative.   Musculoskeletal: Positive for back pain and joint pain.  Neurological: Positive for tingling.  Psychiatric/Behavioral: Positive for depression. The patient has insomnia.     Blood pressure 130/88, pulse (!) 102, height 6' (1.829 m), weight 221 lb 9.6 oz (100.5 kg).Body mass index is 30.05 kg/m.  General Appearance: Casual and Well Groomed  Eye Contact:  Good  Speech:  Clear and Coherent  Volume:  Normal  Mood:  Depressed and Dysphoric  Affect:  Congruent  Thought Process:  Goal Directed  Orientation:  Full (Time, Place, and Person)  Thought Content:  Logical  Suicidal Thoughts:  No  Homicidal Thoughts:  No  Memory:  Immediate;   Good  Judgement:  Good  Insight:  Good  Psychomotor Activity:  Normal  Concentration:  Concentration: Fair  Recall:  Mead of Knowledge:Fair  Language: Good  Akathisia:  Negative  Handed:  Right  AIMS (if indicated):  0  Assets:  Communication Skills Desire for Improvement Financial Resources/Insurance Housing Leisure Time Social Support Talents/Skills Transportation  ADL's:  Intact  Cognition: WNL  Sleep:  4-5 hours    Treatment Plan Summary: Susan Frye is a 71 year old female with no specific psychiatric history, or prior treatment history until this past 1 year. She has struggled with depression with atypical features in the setting of multiple family members and her husband of 32 years passing away over the past 3 years. She is no longer working, and has noted that the lack of social interaction in retirement has negatively affected her mood. She has a close relationship with her son and daughter, and continues to feel loved and cared for by her family. She has the hopes to be a more involved great-grandmother, and hopes to be more active, going for walks, attending local community events. A major  barrier has been her low mood and energy, and she is hopeful that medications may help her be able to engage more effectively in activating behaviors.  She does not have any history of seizures or  epilepsy, and would benefit from further titration of Wellbutrin.  I'd like to see her off of Ativan, and we have agreed to taper and discontinue her evening lorazepam. I will start Remeron to help her with her sleep and insomnia symptoms, and also augment her antidepressant medication.  1. Moderate episode of recurrent major depressive disorder (HCC)    Initiate Remeron 15 mg nightly Increase Wellbutrin to 300 mg XL Taper Ativan to 0.5 mg nightly, then discontinue in 1 week Stop serotonin vitamin which she received over-the-counter Follow-up in 8 weeks Discussed activating behaviors such as going for walks once a week, increasing sunlight exposure, attending community events, and enjoying book club, attending mental health [facility] [place] events  Aundra Dubin, MD 6/4/20182:11 PM

## 2016-12-25 NOTE — Patient Instructions (Signed)
Decrease Lorazepam to 1 tablet at night for 1 week, then discontinue the medicine  STOP Serotonin vitamin, this can interact with your new medicine  START Mirtazapine 1/2 tablet for 1 week, then increase to the whole tablet whenever you stop the lorazepam  Increase Wellbutrin to 300 mg daily ( I will send in a new tablet for you)

## 2016-12-26 ENCOUNTER — Other Ambulatory Visit (HOSPITAL_COMMUNITY): Payer: Self-pay | Admitting: Psychiatry

## 2016-12-26 ENCOUNTER — Telehealth (HOSPITAL_COMMUNITY): Payer: Self-pay

## 2016-12-26 DIAGNOSIS — F331 Major depressive disorder, recurrent, moderate: Secondary | ICD-10-CM

## 2016-12-26 MED ORDER — BUPROPION HCL ER (XL) 150 MG PO TB24: 300.0000 mg | ORAL_TABLET | Freq: Every day | ORAL | 0 refills | Status: DC

## 2016-12-26 NOTE — Telephone Encounter (Signed)
Encounter complete. 

## 2016-12-27 ENCOUNTER — Telehealth (HOSPITAL_COMMUNITY): Payer: Self-pay

## 2016-12-27 DIAGNOSIS — F331 Major depressive disorder, recurrent, moderate: Secondary | ICD-10-CM

## 2016-12-27 MED ORDER — BUPROPION HCL ER (XL) 300 MG PO TB24: 300.0000 mg | ORAL_TABLET | ORAL | 1 refills | Status: DC

## 2016-12-27 NOTE — Telephone Encounter (Signed)
Medication management - Fax received from patient's [facility] in [place] and then message from patient her insurance will not cover Wellbutrin XL 150 mg, 2 tablets a day, needs to be 300 mg, once a day.  Requests a new 90 day order for Wellbutrin XL 300 mg, one a day.

## 2016-12-28 ENCOUNTER — Ambulatory Visit (HOSPITAL_COMMUNITY)
Admission: RE | Admit: 2016-12-28 | Discharge: 2016-12-28 | Disposition: A | Discharge status: Home / Self Care | Payer: Medicare Other | Source: Ambulatory Visit | Attending: Cardiovascular Disease | Admitting: Cardiovascular Disease

## 2016-12-28 DIAGNOSIS — R0789 Other chest pain: Secondary | ICD-10-CM | POA: Insufficient documentation

## 2016-12-28 LAB — MYOCARDIAL PERFUSION IMAGING
CHL CUP NUCLEAR SRS: 6
CHL CUP NUCLEAR SSS: 8
CSEPPHR: 91 {beats}/min
LV dias vol: 92 mL (ref 46–106)
LVSYSVOL: 33 mL
Rest HR: 70 {beats}/min
SDS: 2
TID: 1.07

## 2016-12-28 MED ORDER — REGADENOSON 0.4 MG/5ML IV SOLN
0.4000 mg | Freq: Once | INTRAVENOUS | Status: AC
  Administered 2016-12-28: 0.4 mg via INTRAVENOUS

## 2016-12-28 MED ORDER — TECHNETIUM TC 99M TETROFOSMIN IV KIT
30.2000 | PACK | Freq: Once | INTRAVENOUS | Status: AC | PRN
  Administered 2016-12-28: 30.2 via INTRAVENOUS
  Filled 2016-12-28: qty 31

## 2016-12-28 MED ORDER — TECHNETIUM TC 99M TETROFOSMIN IV KIT
9.8000 | PACK | Freq: Once | INTRAVENOUS | Status: AC | PRN
  Administered 2016-12-28: 9.8 via INTRAVENOUS
  Filled 2016-12-28: qty 10

## 2016-12-29 ENCOUNTER — Inpatient Hospital Stay (HOSPITAL_COMMUNITY): Admission: RE | Admit: 2016-12-29 | Payer: Medicare Other | Source: Ambulatory Visit

## 2017-01-02 ENCOUNTER — Ambulatory Visit: Payer: Self-pay | Admitting: Physician Assistant

## 2017-01-02 NOTE — Progress Notes (Signed)
Reviewed & agree with plan  

## 2017-01-10 ENCOUNTER — Ambulatory Visit (HOSPITAL_COMMUNITY): Payer: Self-pay | Admitting: Clinical

## 2017-01-16 ENCOUNTER — Other Ambulatory Visit: Payer: Self-pay | Admitting: Pulmonary Disease

## 2017-01-25 ENCOUNTER — Telehealth: Payer: Self-pay | Admitting: Pulmonary Disease

## 2017-01-26 NOTE — Telephone Encounter (Signed)
error 

## 2017-01-29 ENCOUNTER — Encounter: Payer: Self-pay | Admitting: Acute Care

## 2017-01-29 ENCOUNTER — Ambulatory Visit (INDEPENDENT_AMBULATORY_CARE_PROVIDER_SITE_OTHER): Payer: Medicare Other | Admitting: Acute Care

## 2017-01-29 DIAGNOSIS — J449 Chronic obstructive pulmonary disease, unspecified: Secondary | ICD-10-CM

## 2017-01-29 DIAGNOSIS — R0600 Dyspnea, unspecified: Secondary | ICD-10-CM | POA: Diagnosis not present

## 2017-01-29 DIAGNOSIS — J9621 Acute and chronic respiratory failure with hypoxia: Secondary | ICD-10-CM

## 2017-01-29 NOTE — Assessment & Plan Note (Signed)
Continue wearing oxygen 2 L nasal cannula at bedtime as prescribed to maintain oxygen saturations between 88-92%

## 2017-01-29 NOTE — Assessment & Plan Note (Addendum)
Continue complaints of dyspnea Patient is compliant with her nor oh, however is not using her DuoNeb at all. Additionally patient only wears oxygen 2 L nasal cannula at that time Plan: Please follow up with you psychiatrist regarding your depression. Continue taking your Anoro once daily. Remember to rinse your mouth after use. Start using your DuoNebs every day in the morning and the evening. Wear your oxygen with shortness of breath and at night. Oxygen saturation goal is 88-92% Follow up with Dr. Elsworth Soho 03/02/17 as is already scheduled. Please contact office for sooner follow up if symptoms do not improve or worsen or seek emergency care

## 2017-01-29 NOTE — Patient Instructions (Addendum)
It is nice to see you today. Please follow up with you psychiatrist regarding your depression. Continue taking your Anoro once daily. Remember to rinse your mouth after use. Start using your DuoNebs every day in the morning and the evening. Wear your oxygen with shortness of breath and at night. Follow up with Dr. Elsworth Soho 03/02/17 as is already scheduled. Please contact office for sooner follow up if symptoms do not improve or worsen or seek emergency care

## 2017-01-29 NOTE — Progress Notes (Signed)
History of Present Illness Susan Frye is a 71 y.o. female former smoker ( Quit 2005) with COPD. she is followed by Dr. Elsworth Frye.   01/29/2017 Acute OV: Pt. Presents today for what she states is depression, with some shortness of breath. She states that her breathing was better yesterday and the day before when the humidity is lower. She had a panic attack  and states her breathing has been worse since. She did have a panic attack sometime in June, went to the ED, and was told to have her heart checked.She saw Susan Frye and he did an Stress Test  which  Per the patient is not the cause of her shortness of breath. ( EF 55-65%)  She does not have a cough or any sputum.She denies fever, chest pan, orthopnea or hemoptysis. She has not been using her DuoNebs as prescribed.  Test Results: TEST  Stress Test 12/28/2016:  The left ventricular ejection fraction is normal (55-65%).  Nuclear stress EF: 64%.  There was no ST segment deviation noted during stress.  This is a low risk study.  PFT's 12/10/03 FEV1 1.12 (36%) ratio 49   PFT's 05/31/2011 1.31 (46%) ratio 50 and 13% p B2 with DLC0 40%  Spirometry 04/2013 - unchanged FEV1 at 1.28-44% and FVC of 2.09-53% with ratio 61.   CT screen 03/2016 >> RADS 4A, LLL nodule, old granulomatous disease - calcified hilar LNs + spleen CT chest 07/2016 benign appearance. Recheck in 12 months .   Low risk myocardial perfusion scan 2014   12/2013 Acute OV ,D-dimer negative, CT angio neg    CBC Latest Ref Rng & Units 07/30/2015 06/24/2015 08/11/2014  WBC 4.0 - 10.5 K/uL 7.2 10.5 7.4  Hemoglobin 12.0 - 15.0 g/dL 14.1 14.0 13.0  Hematocrit 36.0 - 46.0 % 42.4 43.2 39.0  Platelets 150.0 - 400.0 K/uL 286.0 190 173    BMP Latest Ref Rng & Units 07/30/2015 06/29/2015 06/28/2015  Glucose 70 - 99 mg/dL 108(H) 93 126(H)  BUN 6 - 23 mg/dL 16 20 20   Creatinine 0.40 - 1.20 mg/dL 0.72 0.74 0.67  Sodium 135 - 145 mEq/L 137 140 138  Potassium 3.5 - 5.1 mEq/L 3.4(L) 3.9 4.6    Chloride 96 - 112 mEq/L 98 102 102  CO2 19 - 32 mEq/L 29 33(H) 31  Calcium 8.4 - 10.5 mg/dL 9.7 8.8(L) 8.8(L)    BNP    Component Value Date/Time   BNP 50.7 06/24/2015 1720    ProBNP    Component Value Date/Time   PROBNP 26.0 07/30/2015 1238    Past medical hx Past Medical History:  Diagnosis Date  . Arthritis   . Cancer ([place])    skin  . COPD (chronic obstructive pulmonary disease) ([place])   . Depression   . 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 Hypertension   . Spinal stenosis      Social History  Substance Use Topics  . Smoking status: Former Smoker    Packs/day: 2.00    Years: 39.00    Types: Cigarettes    Quit date: 04/03/2004  . Smokeless tobacco: Never Used  . Alcohol use 2.4 oz/week    4 Cans of beer per week     Comment: Social    Tobacco Cessation: Former smoker quit 2005  Past surgical hx, Family hx, Social hx all reviewed.  Current Outpatient Prescriptions on File Prior to Visit  Medication Sig  . albuterol (PROVENTIL HFA;VENTOLIN HFA) 108 (90 BASE) MCG/ACT inhaler  Inhale 2 puffs into the lungs every 6 (six) hours as needed for wheezing or shortness of breath.  Jearl Klinefelter ELLIPTA 62.5-25 MCG/INH AEPB INHALE 1 PUFF INTO THE LUNGS ONCE DAILY  . buPROPion (WELLBUTRIN XL) 300 MG 24 hr tablet Take 1 tablet (300 mg total) by mouth every morning.  . Cholecalciferol (VITAMIN D3) 5000 units CAPS Take 5,000 Units by mouth daily.  Marland Kitchen gabapentin (NEURONTIN) 300 MG capsule Take 600 mg by mouth at bedtime.  . hydrochlorothiazide (HYDRODIURIL) 25 MG tablet Take 25 mg by mouth daily.  Marland Kitchen HYDROcodone-acetaminophen (NORCO) 10-325 MG tablet Take 1 tablet by mouth 3 (three) times daily as needed.  Marland Kitchen ipratropium-albuterol (DUONEB) 0.5-2.5 (3) MG/3ML SOLN Take 3 mLs by nebulization 3 (three) times daily. Dx; copd  j44.9 (Patient taking differently: Take 3 mLs by nebulization 3 (three) times daily as needed. Dx; copd  j44.9)  . mirtazapine (REMERON) 15  MG tablet Take 1 tablet (15 mg total) by mouth at bedtime.   No current facility-administered medications on file prior to visit.      Allergies  Allergen Reactions  . Ace Inhibitors     Cough    Review Of Systems:  Constitutional:   No  weight loss, night sweats,  Fevers, chills, fatigue, or  lassitude.  HEENT:   No headaches,  Difficulty swallowing,  Tooth/dental problems, or  Sore throat,                No sneezing, itching, ear ache, nasal congestion, post nasal drip,   CV:  No chest pain,  Orthopnea, PND, swelling in lower extremities, anasarca, dizziness, palpitations, syncope.   GI  No heartburn, indigestion, abdominal pain, nausea, vomiting, diarrhea, change in bowel habits, loss of appetite, bloody stools.   Resp: No shortness of breath with exertion or at rest.  No excess mucus, no productive cough,  No non-productive cough,  No coughing up of blood.  No change in color of mucus.  No wheezing.  No chest wall deformity  Skin: no rash or lesions.  GU: no dysuria, change in color of urine, no urgency or frequency.  No flank pain, no hematuria   MS:  No joint pain or swelling.  No decreased range of motion.  No back pain.  Psych:  No change in mood or affect. + depression or anxiety.  No memory loss.   Vital Signs BP (!) 152/80 (BP Location: Left Arm, Cuff Size: Normal)   Pulse 100   Ht 6' (1.829 m)   Wt 219 lb 12.8 oz (99.7 kg)   SpO2 96%   BMI 29.81 kg/m    Physical Exam:  General- No distress,  A&Ox3, pleasant ENT: No sinus tenderness, TM clear, pale nasal mucosa, no oral exudate,no post nasal drip, no LAN Cardiac: S1, S2, regular rate and rhythm, no murmur Chest: No wheeze/ rales/ dullness; no accessory muscle use, no nasal flaring, no sternal retractions Abd.: Soft Non-tender, obese, BS + Ext: No clubbing cyanosis, edema Neuro:  Deconditioned at baseline, A&O x 3, MAE x 4, deconditioned at baseline. Skin: No rashes, warm and dry, intact without rash or  lesions noted. Psych: Depressed by her own admission.   Assessment/Plan  Dyspnea Continue complaints of dyspnea Patient is compliant with her nor oh, however is not using her DuoNeb at all. Additionally patient only wears oxygen 2 L nasal cannula at that time Plan: Please follow up with you psychiatrist regarding your depression. Continue taking your Anoro once daily. Remember to rinse  your mouth after use. Start using your DuoNebs every day in the morning and the evening. Wear your oxygen with shortness of breath and at night. Oxygen saturation goal is 88-92% Follow up with Dr. Elsworth Frye 03/02/17 as is already scheduled. Please contact office for sooner follow up if symptoms do not improve or worsen or seek emergency care    COPD GOLD III Appears stable without exacerbation despite complaints of dyspnea Patient is currently not using her DuoNeb's as prescribed. Plan Continue Anoro Add duo nebs  3 times daily as prescribed Follow-up 03/02/2017 with Dr. Elsworth Frye as is already scheduled to evaluate effectiveness   Acute on chronic respiratory failure with hypoxia ([place]) Continue wearing oxygen 2 L nasal cannula at bedtime as prescribed to maintain oxygen saturations between 88-92%    Magdalen Spatz, NP 01/29/2017  1:45 PM

## 2017-01-29 NOTE — Assessment & Plan Note (Signed)
Appears stable without exacerbation despite complaints of dyspnea Patient is currently not using her DuoNeb's as prescribed. Plan Continue Anoro Add duo nebs  3 times daily as prescribed Follow-up 03/02/2017 with Dr. Elsworth Soho as is already scheduled to evaluate effectiveness

## 2017-01-30 NOTE — Progress Notes (Signed)
Reviewed & agree with plan  

## 2017-02-05 ENCOUNTER — Telehealth (HOSPITAL_COMMUNITY): Payer: Self-pay

## 2017-02-05 NOTE — Telephone Encounter (Signed)
Medication problem - Telephone call with patient to follow up on call she left she was having a lot of problems with current Wellbutrin XL 300mg . States she is more depresseda and anxious. Denies suicidal or homicidal ideations but requests change.  Informed patient Dr. Daron Offer was out this week on vacation so would send problem to Dr. Lovena Le who will be in the office on 02/06/17.  Patient stated she was fine until could hear back from Dr. Lovena Le or nursing staff with outpatient then.  Again patient denies any current suicidal or homicidal ideations but speech was more pressured and she admitted to increased anxiousness with Wellbutrin.

## 2017-02-07 ENCOUNTER — Other Ambulatory Visit (HOSPITAL_COMMUNITY): Payer: Self-pay

## 2017-02-07 MED ORDER — BUPROPION HCL ER (XL) 150 MG PO TB24: 150.0000 mg | ORAL_TABLET | Freq: Every day | ORAL | 0 refills | Status: DC

## 2017-02-21 ENCOUNTER — Ambulatory Visit (HOSPITAL_COMMUNITY): Payer: Self-pay | Admitting: Psychiatry

## 2017-03-02 ENCOUNTER — Ambulatory Visit: Payer: Self-pay | Admitting: Pulmonary Disease

## 2017-03-05 ENCOUNTER — Ambulatory Visit (INDEPENDENT_AMBULATORY_CARE_PROVIDER_SITE_OTHER): Payer: Medicare Other | Admitting: Acute Care

## 2017-03-05 ENCOUNTER — Encounter: Payer: Self-pay | Admitting: Acute Care

## 2017-03-05 VITALS — BP 138/80 | HR 98 | Ht 72.0 in | Wt 226.2 lb

## 2017-03-05 DIAGNOSIS — J449 Chronic obstructive pulmonary disease, unspecified: Secondary | ICD-10-CM

## 2017-03-05 DIAGNOSIS — J9611 Chronic respiratory failure with hypoxia: Secondary | ICD-10-CM

## 2017-03-05 NOTE — Patient Instructions (Addendum)
It is nice to see you today. Please use your DuoNebs 3 times daily as prescribed. Continue your Anoro Daily without fail. Rinse mouth after use. Use your rescue inhaler as needed. Please wear your oxygen at 2L to maintain your oxygen saturation at 88-92%. Referral for Pulmonary Rehab starting in October 2018 ( Pt. Is moving back to [place]) Follow up in 3 months with Dr. Elsworth Soho with PFT's  prior . Make appointment with your psychiatrist for your depression             . Please contact office for sooner follow up if symptoms do not improve or worsen or seek emergency care

## 2017-03-05 NOTE — Assessment & Plan Note (Signed)
Patient remains noncompliant with her regimen She is only using her DuoNeb's once daily despite dyspnea on exertion High suspicion deconditioning is an element Plan Please use your DuoNebs 3 times daily as prescribed. Continue your Anoro Daily without fail. Rinse mouth after use. Use your rescue inhaler as needed. Please wear your oxygen at 2L to maintain your oxygen saturation at 88-92%. Referral for Pulmonary Rehab starting in October 2018 ( Pt. Is moving back to [place]) Follow up in 3 months with Dr. Elsworth Soho with PFT's  prior . Make appointment with your psychiatrist for your depression             . Please contact office for sooner follow up if symptoms do not improve or worsen or seek emergency care

## 2017-03-05 NOTE — Progress Notes (Signed)
History of Present Illness Susan Frye is a 71 y.o. female former smoker ( Quit 2005) with COPD. She is followed by Dr. Elsworth Soho.   03/05/2017 3 month follow up: Pt. Presents for 3 month follow up, although she was seen last 01/29/2017.. She presents today stating she is having worsening shortness of breath. She denies any productive cough, or non-productive cough.She states she is not using her Duonebs 3 times daily as prescribed. She is also not wearing her oxygen as prescribed. She is compliant with her Anoro once daily.She has significant complaints of depression. She is currently not using her oxygen.It was off when I came into the exam room. She states she knows she should be using it. We had a long discussion about compliance. She denies fever, chest pain, orthopnea or hemoptysis. She is going to be moving to [place] in September. She feels will help with her depression. She states she feels her depression is related to her multiple medical issues, and loneliness. She is followed by a psychiatrist.                                                                                     Test Results:  CBC Latest Ref Rng & Units 07/30/2015 06/24/2015 08/11/2014  WBC 4.0 - 10.5 K/uL 7.2 10.5 7.4  Hemoglobin 12.0 - 15.0 g/dL 14.1 14.0 13.0  Hematocrit 36.0 - 46.0 % 42.4 43.2 39.0  Platelets 150.0 - 400.0 K/uL 286.0 190 173    BMP Latest Ref Rng & Units 07/30/2015 06/29/2015 06/28/2015  Glucose 70 - 99 mg/dL 108(H) 93 126(H)  BUN 6 - 23 mg/dL 16 20 20   Creatinine 0.40 - 1.20 mg/dL 0.72 0.74 0.67  Sodium 135 - 145 mEq/L 137 140 138  Potassium 3.5 - 5.1 mEq/L 3.4(L) 3.9 4.6  Chloride 96 - 112 mEq/L 98 102 102  CO2 19 - 32 mEq/L 29 33(H) 31  Calcium 8.4 - 10.5 mg/dL 9.7 8.8(L) 8.8(L)    BNP    Component Value Date/Time   BNP 50.7 06/24/2015 1720    ProBNP    Component Value Date/Time   PROBNP 26.0 07/30/2015 1238      Past medical hx Past Medical History:  Diagnosis Date  .  Arthritis   . Cancer ([place])    skin  . COPD (chronic obstructive pulmonary disease) ([place])   . Depression   . 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 Hypertension   . Spinal stenosis      Social History  Substance Use Topics  . Smoking status: Former Smoker    Packs/day: 2.00    Years: 39.00    Types: Cigarettes    Quit date: 04/03/2004  . Smokeless tobacco: Never Used  . Alcohol use 2.4 oz/week    4 Cans of beer per week     Comment: Social    Ms.Cuffie reports that she quit smoking about 12 years ago. Her smoking use included Cigarettes. She has a 78.00 pack-year smoking history. She has never used smokeless tobacco. She reports that she drinks about 2.4 oz of alcohol per week . She reports that she does not use drugs.  Tobacco Cessation: Former 78-pack-year smoker who quit in 2005  Past surgical hx, Family hx, Social hx all reviewed.  Current Outpatient Prescriptions on File Prior to Visit  Medication Sig  . albuterol (PROVENTIL HFA;VENTOLIN HFA) 108 (90 BASE) MCG/ACT inhaler Inhale 2 puffs into the lungs every 6 (six) hours as needed for wheezing or shortness of breath.  Jearl Klinefelter ELLIPTA 62.5-25 MCG/INH AEPB INHALE 1 PUFF INTO THE LUNGS ONCE DAILY  . Cholecalciferol (VITAMIN D3) 5000 units CAPS Take 5,000 Units by mouth daily.  Marland Kitchen gabapentin (NEURONTIN) 300 MG capsule Take 600 mg by mouth at bedtime.  . hydrochlorothiazide (HYDRODIURIL) 25 MG tablet Take 25 mg by mouth daily.  Marland Kitchen HYDROcodone-acetaminophen (NORCO) 10-325 MG tablet Take 1 tablet by mouth 3 (three) times daily as needed.  Marland Kitchen ipratropium-albuterol (DUONEB) 0.5-2.5 (3) MG/3ML SOLN Take 3 mLs by nebulization 3 (three) times daily. Dx; copd  j44.9 (Patient taking differently: Take 3 mLs by nebulization 3 (three) times daily as needed. Dx; copd  j44.9)  . mirtazapine (REMERON) 15 MG tablet Take 1 tablet (15 mg total) by mouth at bedtime.  . vitamin B-12 (CYANOCOBALAMIN) 1000 MCG tablet Take 1,000  mcg by mouth daily.   No current facility-administered medications on file prior to visit.      Allergies  Allergen Reactions  . Ace Inhibitors     Cough    Review Of Systems:  Constitutional:   No  weight loss, night sweats,  Fevers, chills, fatigue, or  lassitude.  HEENT:   No headaches,  Difficulty swallowing,  Tooth/dental problems, or  Sore throat,                No sneezing, itching, ear ache, nasal congestion, post nasal drip,   CV:  No chest pain,  Orthopnea, PND, swelling in lower extremities, anasarca, dizziness, palpitations, syncope.   GI  No heartburn, indigestion, abdominal pain, nausea, vomiting, diarrhea, change in bowel habits, loss of appetite, bloody stools.   Resp: +shortness of breath with exertion or at rest.  No excess mucus, no productive cough,  No non-productive cough,  No coughing up of blood.  No change in color of mucus.  No wheezing.  No chest wall deformity  Skin: no rash or lesions.  GU: no dysuria, change in color of urine, no urgency or frequency.  No flank pain, no hematuria   MS:  No joint pain or swelling.  No decreased range of motion.  No back pain.  Psych:  No change in mood or affect. No depression or anxiety.  No memory loss.   Vital Signs BP 138/80 (BP Location: Left Arm, Cuff Size: Normal)   Pulse 98   Ht 6' (1.829 m)   Wt 226 lb 3.2 oz (102.6 kg)   SpO2 96%   BMI 30.68 kg/m    Physical Exam:  General- No distress,  A&Ox3, pleasant but scattered ENT: No sinus tenderness, TM clear, pale nasal mucosa, no oral exudate,no post nasal drip, no LAN Cardiac: S1, S2, regular rate and rhythm, no murmur Chest: Faint expiratory wheeze/ no rales/ dullness; no accessory muscle use, no nasal flaring, no sternal retractions Abd.: Soft Non-tender, bowel sounds positive, nondistended. Ext: No clubbing cyanosis, edema Neuro:  Deconditioned at baseline, walks with a walker Skin: No rashes, warm and dry Psych: normal mood and  behavior   Assessment/Plan  COPD without exacerbation ([place]) Patient remains noncompliant with her regimen She is only using her DuoNeb's once daily despite dyspnea on exertion High suspicion  deconditioning is an element Plan Please use your DuoNebs 3 times daily as prescribed. Continue your Anoro Daily without fail. Rinse mouth after use. Use your rescue inhaler as needed. Please wear your oxygen at 2L to maintain your oxygen saturation at 88-92%. Referral for Pulmonary Rehab starting in October 2018 ( Pt. Is moving back to [place]) Follow up in 3 months with Dr. Elsworth Soho with PFT's  prior . Make appointment with your psychiatrist for your depression             . Please contact office for sooner follow up if symptoms do not improve or worsen or seek emergency care     Chronic respiratory failure with hypoxia ([place]) Noncompliance with oxygen Plan Encouraged oxygen compliance Discussed that many of the benefits we get from wearing oxygen  Please wear oxygen at 2 L nasal cannula with activity and at bedtime    Magdalen Spatz, NP 03/05/2017  9:23 PM

## 2017-03-05 NOTE — Assessment & Plan Note (Signed)
Noncompliance with oxygen Plan Encouraged oxygen compliance Discussed that many of the benefits we get from wearing oxygen  Please wear oxygen at 2 L nasal cannula with activity and at bedtime

## 2017-03-08 ENCOUNTER — Telehealth: Payer: Self-pay | Admitting: Pulmonary Disease

## 2017-03-08 DIAGNOSIS — R0600 Dyspnea, unspecified: Secondary | ICD-10-CM

## 2017-03-08 NOTE — Telephone Encounter (Signed)
SG referred patient to pulmonary rehab (starting in October 2018 when pt moves back to [place]) at the last ov on 8.13.18 for COPD.  Pt's last PFT was in 2012 but she does have an upcoming PFT scheduled with ov w/ RA on 11.16.18  Routing message to SG since she last patient Susan Frye - would changing the diagnosis be appropriate, or postpone the referral until the ov w/ RA?  Please advise, thank you!

## 2017-03-08 NOTE — Telephone Encounter (Signed)
Spoke with Judson Roch and was advised ok to change ordering diagnosis to dyspnea and sign under RA. Order has been placed. Portia verbalized understanding and denied any further questions or concerns at this time.

## 2017-04-27 ENCOUNTER — Ambulatory Visit (HOSPITAL_COMMUNITY): Payer: Self-pay | Admitting: Psychiatry

## 2017-05-05 ENCOUNTER — Inpatient Hospital Stay (HOSPITAL_COMMUNITY)
Admission: EM | Admit: 2017-05-05 | Discharge: 2017-05-08 | DRG: 190 | Disposition: A | Discharge status: Home / Self Care | Payer: Medicare Other | Attending: Internal Medicine | Admitting: Internal Medicine

## 2017-05-05 ENCOUNTER — Encounter (HOSPITAL_COMMUNITY): Payer: Self-pay | Admitting: *Deleted

## 2017-05-05 ENCOUNTER — Emergency Department (HOSPITAL_COMMUNITY): Payer: Medicare Other

## 2017-05-05 DIAGNOSIS — J9611 Chronic respiratory failure with hypoxia: Secondary | ICD-10-CM | POA: Diagnosis present

## 2017-05-05 DIAGNOSIS — J9621 Acute and chronic respiratory failure with hypoxia: Secondary | ICD-10-CM | POA: Diagnosis present

## 2017-05-05 DIAGNOSIS — J449 Chronic obstructive pulmonary disease, unspecified: Secondary | ICD-10-CM

## 2017-05-05 DIAGNOSIS — Z66 Do not resuscitate: Secondary | ICD-10-CM | POA: Diagnosis present

## 2017-05-05 DIAGNOSIS — J441 Chronic obstructive pulmonary disease with (acute) exacerbation: Principal | ICD-10-CM | POA: Diagnosis present

## 2017-05-05 DIAGNOSIS — Z888 Allergy status to other drugs, medicaments and biological substances status: Secondary | ICD-10-CM

## 2017-05-05 DIAGNOSIS — Z87891 Personal history of nicotine dependence: Secondary | ICD-10-CM

## 2017-05-05 DIAGNOSIS — Z9981 Dependence on supplemental oxygen: Secondary | ICD-10-CM

## 2017-05-05 DIAGNOSIS — Z79899 Other long term (current) drug therapy: Secondary | ICD-10-CM

## 2017-05-05 DIAGNOSIS — I10 Essential (primary) hypertension: Secondary | ICD-10-CM | POA: Diagnosis present

## 2017-05-05 DIAGNOSIS — I872 Venous insufficiency (chronic) (peripheral): Secondary | ICD-10-CM | POA: Diagnosis present

## 2017-05-05 DIAGNOSIS — G8929 Other chronic pain: Secondary | ICD-10-CM | POA: Diagnosis present

## 2017-05-05 DIAGNOSIS — M549 Dorsalgia, unspecified: Secondary | ICD-10-CM | POA: Diagnosis present

## 2017-05-05 DIAGNOSIS — Z8249 Family history of ischemic heart disease and other diseases of the circulatory system: Secondary | ICD-10-CM

## 2017-05-05 DIAGNOSIS — Z825 Family history of asthma and other chronic lower respiratory diseases: Secondary | ICD-10-CM

## 2017-05-05 DIAGNOSIS — F418 Other specified anxiety disorders: Secondary | ICD-10-CM | POA: Diagnosis present

## 2017-05-05 DIAGNOSIS — G629 Polyneuropathy, unspecified: Secondary | ICD-10-CM | POA: Diagnosis present

## 2017-05-05 DIAGNOSIS — Z85828 Personal history of other malignant neoplasm of skin: Secondary | ICD-10-CM

## 2017-05-05 LAB — BASIC METABOLIC PANEL
Anion gap: 10 (ref 5–15)
BUN: 9 mg/dL (ref 6–20)
CHLORIDE: 99 mmol/L — AB (ref 101–111)
CO2: 27 mmol/L (ref 22–32)
CREATININE: 0.69 mg/dL (ref 0.44–1.00)
Calcium: 9.6 mg/dL (ref 8.9–10.3)
GFR calc non Af Amer: >60 mL/min (ref >60)
Glucose, Bld: 105 mg/dL — ABNORMAL HIGH (ref 65–99)
POTASSIUM: 3.8 mmol/L (ref 3.5–5.1)
Sodium: 136 mmol/L (ref 135–145)

## 2017-05-05 LAB — CBC
HEMATOCRIT: 43.2 % (ref 36.0–46.0)
HEMOGLOBIN: 13.8 g/dL (ref 12.0–15.0)
MCH: 29.6 pg (ref 26.0–34.0)
MCHC: 31.9 g/dL (ref 30.0–36.0)
MCV: 92.5 fL (ref 78.0–100.0)
PLATELETS: 203 10*3/uL (ref 150–400)
RBC: 4.67 MIL/uL (ref 3.87–5.11)
RDW: 13.6 % (ref 11.5–15.5)
WBC: 7.6 10*3/uL (ref 4.0–10.5)

## 2017-05-05 LAB — BRAIN NATRIURETIC PEPTIDE: B Natriuretic Peptide: 43.3 pg/mL (ref 0.0–100.0)

## 2017-05-05 LAB — TROPONIN I: Troponin I: <0.03 ng/mL (ref <0.03)

## 2017-05-05 NOTE — ED Triage Notes (Signed)
The pt has had sob  With both feet and ankle swelling  For one week.  She is visibly sob   No pain anywhere.  She is on home 02 2 liters and also at present

## 2017-05-06 DIAGNOSIS — Z9981 Dependence on supplemental oxygen: Secondary | ICD-10-CM | POA: Diagnosis not present

## 2017-05-06 DIAGNOSIS — F418 Other specified anxiety disorders: Secondary | ICD-10-CM | POA: Diagnosis present

## 2017-05-06 DIAGNOSIS — G8929 Other chronic pain: Secondary | ICD-10-CM | POA: Diagnosis present

## 2017-05-06 DIAGNOSIS — I10 Essential (primary) hypertension: Secondary | ICD-10-CM | POA: Diagnosis present

## 2017-05-06 DIAGNOSIS — Z87891 Personal history of nicotine dependence: Secondary | ICD-10-CM | POA: Diagnosis not present

## 2017-05-06 DIAGNOSIS — Z825 Family history of asthma and other chronic lower respiratory diseases: Secondary | ICD-10-CM | POA: Diagnosis not present

## 2017-05-06 DIAGNOSIS — G629 Polyneuropathy, unspecified: Secondary | ICD-10-CM | POA: Diagnosis present

## 2017-05-06 DIAGNOSIS — Z85828 Personal history of other malignant neoplasm of skin: Secondary | ICD-10-CM | POA: Diagnosis not present

## 2017-05-06 DIAGNOSIS — M5441 Lumbago with sciatica, right side: Secondary | ICD-10-CM | POA: Diagnosis not present

## 2017-05-06 DIAGNOSIS — J9611 Chronic respiratory failure with hypoxia: Secondary | ICD-10-CM

## 2017-05-06 DIAGNOSIS — J441 Chronic obstructive pulmonary disease with (acute) exacerbation: Secondary | ICD-10-CM | POA: Diagnosis not present

## 2017-05-06 DIAGNOSIS — I872 Venous insufficiency (chronic) (peripheral): Secondary | ICD-10-CM | POA: Diagnosis present

## 2017-05-06 DIAGNOSIS — Z8249 Family history of ischemic heart disease and other diseases of the circulatory system: Secondary | ICD-10-CM | POA: Diagnosis not present

## 2017-05-06 DIAGNOSIS — J9621 Acute and chronic respiratory failure with hypoxia: Secondary | ICD-10-CM | POA: Diagnosis present

## 2017-05-06 DIAGNOSIS — Z888 Allergy status to other drugs, medicaments and biological substances status: Secondary | ICD-10-CM | POA: Diagnosis not present

## 2017-05-06 DIAGNOSIS — J449 Chronic obstructive pulmonary disease, unspecified: Secondary | ICD-10-CM | POA: Diagnosis present

## 2017-05-06 DIAGNOSIS — M549 Dorsalgia, unspecified: Secondary | ICD-10-CM | POA: Diagnosis present

## 2017-05-06 DIAGNOSIS — Z79899 Other long term (current) drug therapy: Secondary | ICD-10-CM | POA: Diagnosis not present

## 2017-05-06 DIAGNOSIS — Z66 Do not resuscitate: Secondary | ICD-10-CM | POA: Diagnosis present

## 2017-05-06 LAB — URINALYSIS, ROUTINE W REFLEX MICROSCOPIC
Bilirubin Urine: NEGATIVE
GLUCOSE, UA: NEGATIVE mg/dL
Hgb urine dipstick: NEGATIVE
Ketones, ur: 5 mg/dL — AB
NITRITE: NEGATIVE
Protein, ur: NEGATIVE mg/dL
SPECIFIC GRAVITY, URINE: 1.01 (ref 1.005–1.030)
pH: 5 (ref 5.0–8.0)

## 2017-05-06 MED ORDER — METHYLPREDNISOLONE SODIUM SUCC 125 MG IJ SOLR: 125.0000 mg | Freq: Once | INTRAMUSCULAR | Status: DC

## 2017-05-06 MED ORDER — ALBUTEROL SULFATE (2.5 MG/3ML) 0.083% IN NEBU
5.0000 mg | INHALATION_SOLUTION | Freq: Once | RESPIRATORY_TRACT | Status: AC
  Administered 2017-05-06: 5 mg via RESPIRATORY_TRACT
  Filled 2017-05-06: qty 6

## 2017-05-06 MED ORDER — IPRATROPIUM BROMIDE 0.02 % IN SOLN
0.5000 mg | Freq: Once | RESPIRATORY_TRACT | Status: AC
  Administered 2017-05-06: 0.5 mg via RESPIRATORY_TRACT
  Filled 2017-05-06: qty 2.5

## 2017-05-06 MED ORDER — ENOXAPARIN SODIUM 40 MG/0.4ML ~~LOC~~ SOLN
40.0000 mg | SUBCUTANEOUS | Status: DC
  Administered 2017-05-06 – 2017-05-07 (×2): 40 mg via SUBCUTANEOUS
  Filled 2017-05-06 (×3): qty 0.4

## 2017-05-06 MED ORDER — METHYLPREDNISOLONE SODIUM SUCC 40 MG IJ SOLR
40.0000 mg | Freq: Two times a day (BID) | INTRAMUSCULAR | Status: DC
  Administered 2017-05-06 – 2017-05-08 (×5): 40 mg via INTRAVENOUS
  Filled 2017-05-06 (×5): qty 1

## 2017-05-06 MED ORDER — ALBUTEROL SULFATE (2.5 MG/3ML) 0.083% IN NEBU: 2.5000 mg | INHALATION_SOLUTION | RESPIRATORY_TRACT | Status: DC | PRN

## 2017-05-06 MED ORDER — IPRATROPIUM-ALBUTEROL 0.5-2.5 (3) MG/3ML IN SOLN
3.0000 mL | RESPIRATORY_TRACT | Status: DC | PRN
  Filled 2017-05-06: qty 3

## 2017-05-06 MED ORDER — SENNA 8.6 MG PO TABS
1.0000 | ORAL_TABLET | Freq: Every day | ORAL | Status: DC | PRN
  Administered 2017-05-06: 8.6 mg via ORAL
  Filled 2017-05-06: qty 1

## 2017-05-06 MED ORDER — ACETAMINOPHEN 325 MG PO TABS
650.0000 mg | ORAL_TABLET | Freq: Four times a day (QID) | ORAL | Status: DC | PRN
  Administered 2017-05-06 – 2017-05-08 (×3): 650 mg via ORAL
  Filled 2017-05-06 (×3): qty 2

## 2017-05-06 MED ORDER — METHYLPREDNISOLONE SODIUM SUCC 125 MG IJ SOLR
125.0000 mg | Freq: Once | INTRAMUSCULAR | Status: AC
  Administered 2017-05-06: 125 mg via INTRAVENOUS
  Filled 2017-05-06: qty 2

## 2017-05-06 MED ORDER — HYDROCHLOROTHIAZIDE 25 MG PO TABS
25.0000 mg | ORAL_TABLET | Freq: Every day | ORAL | Status: DC
  Administered 2017-05-06: 25 mg via ORAL
  Filled 2017-05-06: qty 1

## 2017-05-06 MED ORDER — GABAPENTIN 300 MG PO CAPS
600.0000 mg | ORAL_CAPSULE | Freq: Every day | ORAL | Status: DC
  Administered 2017-05-06: 600 mg via ORAL
  Filled 2017-05-06: qty 2

## 2017-05-06 MED ORDER — IPRATROPIUM-ALBUTEROL 0.5-2.5 (3) MG/3ML IN SOLN
3.0000 mL | Freq: Three times a day (TID) | RESPIRATORY_TRACT | Status: DC
  Administered 2017-05-07: 3 mL via RESPIRATORY_TRACT
  Filled 2017-05-06: qty 3

## 2017-05-06 MED ORDER — BISACODYL 5 MG PO TBEC: 5.0000 mg | DELAYED_RELEASE_TABLET | Freq: Every day | ORAL | Status: DC | PRN

## 2017-05-06 MED ORDER — IPRATROPIUM-ALBUTEROL 0.5-2.5 (3) MG/3ML IN SOLN
3.0000 mL | Freq: Four times a day (QID) | RESPIRATORY_TRACT | Status: DC
  Administered 2017-05-06 (×2): 3 mL via RESPIRATORY_TRACT
  Filled 2017-05-06 (×2): qty 3

## 2017-05-06 NOTE — ED Notes (Signed)
Report attempted to 6N. (floor/room changed)

## 2017-05-06 NOTE — Plan of Care (Signed)
71 yo w COPD on 2l at baseline Presented with dyspnea, no fever no congestion Lives in independent facility desats to 85% on 2 L oxygen No hx of CHF  IV solumedrol given  Admit for COPD exacerbation Inpatient  Med surge bed  Eliu Batch 6:14 AM

## 2017-05-06 NOTE — ED Notes (Signed)
Report attempted 6E (floor/room changed).

## 2017-05-06 NOTE — Progress Notes (Signed)
Patient requested tylenol and a medicine to help her have a bowel movement, paged MD Buriev, did not receive a call back. Will pass onto night shift to call on call.

## 2017-05-06 NOTE — Progress Notes (Signed)
Patient arrived to 6N12 A&O x4.  2L O2 by [place].  IV intact and saline locked.  VSS.  Oriented to room and staff.  Family at bedside.  Will continue to monitor.

## 2017-05-06 NOTE — ED Provider Notes (Signed)
[facility] DEPT Provider Note   CSN: [id number] Arrival date & time: 05/05/17  1837     History   Chief Complaint Chief Complaint  Patient presents with  . Shortness of Breath    HPI Susan Frye is a 71 y.o. female.  The history is provided by the patient and medical records.  Shortness of Breath  Associated symptoms include leg swelling (ankles).    71 y.o. F with hx of arthritis, skin cancer of right hand s/p removal, COPD, depression, HTN, presenting to the ED with SOB.  States for the past week she has been more SOB than normal.  States she felt like it was due to the humidity but even when it got cooler recently she is still having trouble. states it feels like she cannot take a good deep breath. Denies cough, fever, chest pain. No recent URI symptoms.  She does use home O2 at night and when out and about.  State 2L usually.  Daughter also reports she recently moved into a new indepdently living facility which is making her a little anxious because it is a new environment.  Daughter does feel that she may be working herself up and hyperventilating at times.  Patient also reports some ankle edema.  States she has noticed this over the last few days. She saw her doctor who felt like it was dependent edema-- recommended to elevate her feet.  She was sent to vein specialist-- due to see them next week.  Does have hx of neuropathy.  Past Medical History:  Diagnosis Date  . Arthritis   . Cancer ([place])    skin  . COPD (chronic obstructive pulmonary disease) ([place])   . Depression   . 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 Hypertension   . Spinal stenosis     Patient Active Problem List   Diagnosis Date Noted  . Atypical chest pain 12/14/2016  . Solitary pulmonary nodule 08/08/2016  . 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 Ear pain 09/02/2015  . COPD without exacerbation ([place]) 07/30/2015  . Dyspnea 07/30/2015  . Chronic back  pain 06/24/2015  . Depression   . Chronic venous insufficiency 06/05/2014  . Chronic respiratory failure with hypoxia ([place]) 12/31/2013  . WEIGHT GAIN, ABNORMAL 07/13/2009  . Essential hypertension 07/12/2009  . COPD exacerbation ([place]) 07/12/2009    Past Surgical History:  Procedure Laterality Date  . COSMETIC SURGERY     eye cosmetic surgery    OB History    No data available       Home Medications    Prior to Admission medications   Medication Sig Start Date End Date Taking? Authorizing Provider  albuterol (PROVENTIL HFA;VENTOLIN HFA) 108 (90 BASE) MCG/ACT inhaler Inhale 2 puffs into the lungs every 6 (six) hours as needed for wheezing or shortness of breath.    [provider]  Celedonio Miyamoto 62.5-25 MCG/INH AEPB INHALE 1 PUFF INTO THE LUNGS ONCE DAILY 01/17/17   Rigoberto Noel, MD  Cholecalciferol (VITAMIN D3) 5000 units CAPS Take 5,000 Units by mouth daily.    [provider]  gabapentin (NEURONTIN) 300 MG capsule Take 600 mg by mouth at bedtime.    [provider]  hydrochlorothiazide (HYDRODIURIL) 25 MG tablet Take 25 mg by mouth daily.    [provider]  HYDROcodone-acetaminophen (NORCO) 10-325 MG tablet Take 1 tablet by mouth 3 (three) times daily as needed. 12/27/15   [provider]  ipratropium-albuterol (DUONEB) 0.5-2.5 (3) MG/3ML  SOLN Take 3 mLs by nebulization 3 (three) times daily. Dx; copd  j44.9 Patient taking differently: Take 3 mLs by nebulization 3 (three) times daily as needed. Dx; copd  j44.9 06/29/15   Florencia Reasons, MD  mirtazapine (REMERON) 15 MG tablet Take 1 tablet (15 mg total) by mouth at bedtime. 12/25/16   Eksir, Richard Miu, MD  vitamin B-12 (CYANOCOBALAMIN) 1000 MCG tablet Take 1,000 mcg by mouth daily.    [provider]    Family History Family History  Problem Relation Age of Onset  . Emphysema Mother        smoked  . Lung cancer Mother        smoked  . Varicose Veins Mother   . Emphysema  Father        smoked  . Heart disease Father   . Diabetes Father   . Hyperlipidemia Father   . Hypertension Father   . Heart attack Father   . Cancer - Prostate Father   . Asthma Sister   . Diabetes Sister   . Heart disease Sister   . Diabetes Brother   . Hypertension Brother   . Heart attack Daughter   . Cancer Son     Social History Social History  Substance Use Topics  . Smoking status: Former Smoker    Packs/day: 2.00    Years: 39.00    Types: Cigarettes    Quit date: 04/03/2004  . Smokeless tobacco: Never Used  . Alcohol use 2.4 oz/week    4 Cans of beer per week     Comment: Social     Allergies   Ace inhibitors   Review of Systems Review of Systems  Respiratory: Positive for shortness of breath.   Cardiovascular: Positive for leg swelling (ankles).  All other systems reviewed and are negative.    Physical Exam Updated Vital Signs BP (!) 151/83 (BP Location: Right Arm)   Pulse 91   Temp 97.7 F (36.5 C) (Oral)   Resp (!) 22   Ht 5\' 11"  (1.803 m)   Wt 102.5 kg (226 lb)   SpO2 98%   BMI 31.52 kg/m   Physical Exam  Constitutional: She is oriented to person, place, and time. She appears well-developed and well-nourished.  HENT:  Head: Normocephalic and atraumatic.  Mouth/Throat: Oropharynx is clear and moist.  Eyes: Pupils are equal, round, and reactive to light. Conjunctivae and EOM are normal.  Neck: Normal range of motion.  Cardiovascular: Normal rate, regular rhythm and normal heart sounds.   Pulmonary/Chest: Effort normal. She has decreased breath sounds.  Appears SOB but O2 sats WNL, lung sounds diminished, no significant wheezes/rhonchi/rales  Abdominal: Soft. Bowel sounds are normal.  Musculoskeletal: Normal range of motion.  Trace edema at the ankles, no pitting of the lower legs No calf swelling, tenderness, or palpable cords, no overlying erythema or warmth to touch  Neurological: She is alert and oriented to person, place, and time.    Skin: Skin is warm and dry.  Psychiatric: She has a normal mood and affect.  Nursing note and vitals reviewed.    ED Treatments / Results  Labs (all labs ordered are listed, but only abnormal results are displayed) Labs Reviewed  BASIC METABOLIC PANEL - Abnormal; Notable for the following:       Result Value   Chloride 99 (*)    Glucose, Bld 105 (*)    All other components within normal limits  CBC  TROPONIN I  BRAIN NATRIURETIC PEPTIDE  URINALYSIS, ROUTINE W REFLEX MICROSCOPIC    EKG  EKG Interpretation None       Radiology Dg Chest 2 View  Result Date: 05/05/2017 CLINICAL DATA:  Wheezing and dyspnea for 1 week. Lower extremity swelling for 3 days. EXAM: CHEST  2 VIEW COMPARISON:  01/15/2016 FINDINGS: Moderate hyperinflation, unchanged. Calcified granulomatous changes, as well as mild chronic interstitial coarsening. No airspace consolidation. No effusions. Normal pulmonary vasculature. Normal heart size. Normal hilar and mediastinal contours, unchanged. IMPRESSION: Hyperinflation.  No consolidation or effusion. Electronically Signed   By: Andreas Newport M.D.   On: 05/05/2017 20:18    Procedures Procedures (including critical care time)  Medications Ordered in ED Medications  albuterol (PROVENTIL) (2.5 MG/3ML) 0.083% nebulizer solution 2.5 mg (not administered)  albuterol (PROVENTIL) (2.5 MG/3ML) 0.083% nebulizer solution 5 mg (5 mg Nebulization Given 05/06/17 0151)  ipratropium (ATROVENT) nebulizer solution 0.5 mg (0.5 mg Nebulization Given 05/06/17 0152)  methylPREDNISolone sodium succinate (SOLU-MEDROL) 125 mg/2 mL injection 125 mg (125 mg Intravenous Given 05/06/17 0228)  albuterol (PROVENTIL) (2.5 MG/3ML) 0.083% nebulizer solution 5 mg (5 mg Nebulization Given 05/06/17 0518)     Initial Impression / Assessment and Plan / ED Course  I have reviewed the triage vital signs and the nursing notes.  Pertinent labs & imaging results that were available during my  care of the patient were reviewed by me and considered in my medical decision making (see chart for details).  71 year old female here with shortness of breath. Has been ongoing for a week now. States she feels like she cannot get a deep breath. Also has some mild ankle edema. On exam she is afebrile and nontoxic. She does appear short of breath with mild tachypnea. Her lungs are overall clear but very diminished. She does have trace edema of the ankles but no calf asymmetry, tenderness, or palpable cords. No overlying erythema. Denies chest pain.  EKG reassuring.  Labs and CXR obtained from triage and overall reassuring.  Solu-medrol and nebs ordered.  After initial nebs patient states she is feeling better.  No wheezes heard on repeat exam, still somewhat diminished.  O2 sats remain stable on RA on her 2L.  Patient was ambulated and dropped down to 85% while walking.  States she got very SOB and shaky with this. Upon returning to room and sitting down, sats slowly returned to normal.  Remains without chest pain or tachycardia.  Do not clinically suspect PE. No signs of DVT on exam.  At this point, patient will require admission.  Patient agreeable to this.    Discussed with hospitalist, Dr. Roel Cluck-- morning team will admit.  She requested temporary holding orders which I have placed.  Patient seen and evaluated with attending physician, Dr. Wyvonnia Dusky, who agrees with assessment and plan of care.  Final Clinical Impressions(s) / ED Diagnoses   Final diagnoses:  Chronic obstructive pulmonary disease, unspecified COPD type [facility])    New Prescriptions New Prescriptions   No medications on file     Larene Pickett, PA-C 05/06/17 0617    Ezequiel Essex, MD 05/06/17 [phone]

## 2017-05-06 NOTE — ED Notes (Signed)
Ordered lunch tray 

## 2017-05-06 NOTE — ED Notes (Signed)
Report attempted 6N

## 2017-05-06 NOTE — ED Notes (Signed)
Patient was ambulated on 2 L of O2. Sats were between 85-92% during ambulation. Patient became winded and felt short of breath.

## 2017-05-06 NOTE — H&P (Signed)
[facility] History and Physical  Susan Frye ZDG:[id number] DOB: 29-Oct-1945 DOA: 05/05/2017  Referring physician:  PCP: Christain Sacramento, MD  Specialists:   Chief Complaint: SOB  HPI: Susan Frye is a 71 y.o. female with PMH of HTN, Depression, COPD, Chronic respiratory failure on home oxygen, spinal stenosis is presented with progressive shortness of breath. Patient states that she has been more short of breath that usual for 1 week. She felt like it was due to humidity. She tried to use her home inhalers without much improvement and presented to emergency room for further evaluation. She usually on home oxygen at 2 lpm as needed and at night. Her family reported that patient has hyperventilating due to shortness of breath at times. She denies acute chest pains, no fevers, no productive cough, no orthopnea, or PNDs. -ED: patient received bronchodilator treatment, solumedrol, then found to have hypoxia at 85 % while walking. hospitalits is called for admission for COPD exacerbation   Review of Systems: The patient denies anorexia, fever, weight loss,, vision loss, decreased hearing, hoarseness, chest pain, syncope, dyspnea on exertion, peripheral edema, balance deficits, hemoptysis, abdominal pain, melena, hematochezia, severe indigestion/heartburn, hematuria, incontinence, genital sores, muscle weakness, suspicious skin lesions, transient blindness, difficulty walking, depression, unusual weight change, abnormal bleeding, enlarged lymph nodes, angioedema, and breast masses.    Past Medical History:  Diagnosis Date  . Arthritis   . Cancer ([place])    skin  . COPD (chronic obstructive pulmonary disease) ([place])   . Depression   . 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 Hypertension   . Spinal stenosis    Past Surgical History:  Procedure Laterality Date  . COSMETIC SURGERY     eye cosmetic surgery   Social History:  reports that she quit smoking about 13  years ago. Her smoking use included Cigarettes. She has a 78.00 pack-year smoking history. She has never used smokeless tobacco. She reports that she drinks about 2.4 oz of alcohol per week . She reports that she does not use drugs. ALF.  where does patient live--home, ALF, SNF? and with whom if at home? Yes;  Can patient participate in ADLs?  Allergies  Allergen Reactions  . Ace Inhibitors     Cough    Family History  Problem Relation Age of Onset  . Emphysema Mother        smoked  . Lung cancer Mother        smoked  . Varicose Veins Mother   . Emphysema Father        smoked  . Heart disease Father   . Diabetes Father   . Hyperlipidemia Father   . Hypertension Father   . Heart attack Father   . Cancer - Prostate Father   . Asthma Sister   . Diabetes Sister   . Heart disease Sister   . Diabetes Brother   . Hypertension Brother   . Heart attack Daughter   . Cancer Son     (be sure to complete)  Prior to Admission medications   Medication Sig Start Date End Date Taking? Authorizing Provider  albuterol (PROVENTIL HFA;VENTOLIN HFA) 108 (90 BASE) MCG/ACT inhaler Inhale 2 puffs into the lungs every 6 (six) hours as needed for wheezing or shortness of breath.   Yes [provider]  Celedonio Miyamoto 62.5-25 MCG/INH AEPB INHALE 1 PUFF INTO THE LUNGS ONCE DAILY 01/17/17  Yes Rigoberto Noel, MD  gabapentin (NEURONTIN) 300 MG capsule Take 600 mg by  mouth at bedtime.   Yes [provider]  hydrochlorothiazide (HYDRODIURIL) 25 MG tablet Take 25 mg by mouth daily.   Yes [provider]  HYDROcodone-acetaminophen (NORCO) 10-325 MG tablet Take 1 tablet by mouth 3 (three) times daily as needed for moderate pain.  12/27/15  Yes [provider]  ipratropium-albuterol (DUONEB) 0.5-2.5 (3) MG/3ML SOLN Take 3 mLs by nebulization 3 (three) times daily. Dx; copd  j44.9 Patient taking differently: Take 3 mLs by nebulization 3 (three) times daily as needed. Dx; copd   j44.9 06/29/15  Yes Florencia Reasons, MD   Physical Exam: Vitals:   05/06/17 0500 05/06/17 0600  BP: (!) 127/54 (!) 123/54  Pulse: 91 98  Resp: 19   Temp:    SpO2: 97% 95%     General:  Alert. No distress   Eyes: eom-I, perrla   ENT: no oral ulcers   Neck: supple. No JVD  Cardiovascular: s1,s2 rrr  Respiratory: diminished ventilation BL  Abdomen: soft.nt, nd   Skin: no rash  Musculoskeletal: no joint edema   Psychiatric: no hallucinations   Neurologic: CN 2-12 intact. Motor 5/5 BL:  Labs on Admission:  Basic Metabolic Panel:  Recent Labs Lab 05/05/17 1912  NA 136  K 3.8  CL 99*  CO2 27  GLUCOSE 105*  BUN 9  CREATININE 0.69  CALCIUM 9.6   Liver Function Tests: No results for input(s): AST, ALT, ALKPHOS, BILITOT, PROT, ALBUMIN in the last 168 hours. No results for input(s): LIPASE, AMYLASE in the last 168 hours. No results for input(s): AMMONIA in the last 168 hours. CBC:  Recent Labs Lab 05/05/17 1912  WBC 7.6  HGB 13.8  HCT 43.2  MCV 92.5  PLT 203   Cardiac Enzymes:  Recent Labs Lab 05/05/17 1912  TROPONINI <0.03    BNP (last 3 results)  Recent Labs  05/05/17 1912  BNP 43.3    ProBNP (last 3 results) No results for input(s): PROBNP in the last 8760 hours.  CBG: No results for input(s): GLUCAP in the last 168 hours.  Radiological Exams on Admission: Dg Chest 2 View  Result Date: 05/05/2017 CLINICAL DATA:  Wheezing and dyspnea for 1 week. Lower extremity swelling for 3 days. EXAM: CHEST  2 VIEW COMPARISON:  01/15/2016 FINDINGS: Moderate hyperinflation, unchanged. Calcified granulomatous changes, as well as mild chronic interstitial coarsening. No airspace consolidation. No effusions. Normal pulmonary vasculature. Normal heart size. Normal hilar and mediastinal contours, unchanged. IMPRESSION: Hyperinflation.  No consolidation or effusion. Electronically Signed   By: Andreas Newport M.D.   On: 05/05/2017 20:18    EKG: Independently  reviewed.   Assessment/Plan Active Problems:   Essential hypertension   COPD exacerbation (HCC)   Chronic respiratory failure with hypoxia (HCC)   Chronic back pain   71 y.o. female with PMH of HTN, Depression, COPD, Chronic respiratory failure on home oxygen, spinal stenosis is presented with progressive shortness of breath and admitted with copd exacerbation   COPD exacerbation. Will cont steroids, scheduled and prn bronchodilators, hold antibiotics (no s/s of pneumonia). Monitor   Acute on chronic hypoxic respiratory failure due to COPD. Hypoxia-> resolved with low flow oxygen. Cont as above, cont oxygen    HTN. Resume home regimen. Monitor  Chronic neuropathy. Stable    None.  if consultant consulted, please document name and whether formally or informally consulted  Code Status: DNR (must indicate code status--if unknown or must be presumed, indicate so) Family Communication: d/w patient (indicate person spoken with, if  applicable, with phone number if by telephone) Disposition Plan: home 2-3 days (indicate anticipated LOS)  Time spent: >35 minutes  Kinnie Feil [facility] Pager [phone] for 05/06/2017  If 7PM-7AM, please contact night-coverage [email or web address] Password [email or web address] 05/06/2017, 8:42 AM

## 2017-05-07 LAB — TSH: TSH: 0.794 u[IU]/mL (ref 0.350–4.500)

## 2017-05-07 MED ORDER — IPRATROPIUM-ALBUTEROL 0.5-2.5 (3) MG/3ML IN SOLN: 3.0000 mL | Freq: Four times a day (QID) | RESPIRATORY_TRACT | Status: DC

## 2017-05-07 MED ORDER — GABAPENTIN 300 MG PO CAPS
600.0000 mg | ORAL_CAPSULE | Freq: Every day | ORAL | Status: DC
  Administered 2017-05-07: 600 mg via ORAL
  Filled 2017-05-07: qty 2

## 2017-05-07 MED ORDER — POLYETHYLENE GLYCOL 3350 17 G PO PACK
17.0000 g | PACK | Freq: Two times a day (BID) | ORAL | Status: DC
  Administered 2017-05-07 – 2017-05-08 (×3): 17 g via ORAL
  Filled 2017-05-07 (×3): qty 1

## 2017-05-07 MED ORDER — IPRATROPIUM-ALBUTEROL 0.5-2.5 (3) MG/3ML IN SOLN: 3.0000 mL | Freq: Two times a day (BID) | RESPIRATORY_TRACT | Status: DC

## 2017-05-07 MED ORDER — IPRATROPIUM-ALBUTEROL 0.5-2.5 (3) MG/3ML IN SOLN
3.0000 mL | Freq: Three times a day (TID) | RESPIRATORY_TRACT | Status: DC
  Administered 2017-05-07 – 2017-05-08 (×3): 3 mL via RESPIRATORY_TRACT
  Filled 2017-05-07 (×3): qty 3

## 2017-05-07 MED ORDER — ALPRAZOLAM 0.25 MG PO TABS
0.2500 mg | ORAL_TABLET | Freq: Every day | ORAL | Status: DC | PRN
  Administered 2017-05-07 – 2017-05-08 (×2): 0.25 mg via ORAL
  Filled 2017-05-07 (×2): qty 1

## 2017-05-07 NOTE — Progress Notes (Signed)
PROGRESS NOTE    Susan Frye  LYY:[id number] DOB: 30-May-1946 DOA: 05/05/2017 PCP: Christain Sacramento, MD    Brief Narrative:  Susan Frye is a 71 y.o. female with PMH of HTN, Depression, COPD, Chronic respiratory failure on home oxygen, spinal stenosis is presented with progressive shortness of breath. Patient states that she has been more short of breath that usual for 1 week. She felt like it was due to humidity. She tried to use her home inhalers without much improvement and presented to emergency room for further evaluation. She usually on home oxygen at 2 lpm as needed and at night. Her family reported that patient has hyperventilating due to shortness of breath at times. She denies acute chest pains, no fevers, no productive cough, no orthopnea, or PNDs. -ED: patient received bronchodilator treatment, solumedrol, then found to have hypoxia at 85 % while walking. hospitalits is called for admission for COPD exacerbation    Assessment & Plan:   Active Problems:   Essential hypertension   COPD exacerbation (HCC)   Chronic respiratory failure with hypoxia (HCC)   Chronic back pain   1-Acute COPD exacerbation;  Continue with nebulizer, solumedrol.  Home in 24 hours.   2-Anxiety;  Xanax PRN  Acute on chronic hypoxic respiratory failure due to COPD. Hypoxia-> resolved with low flow oxygen. Cont as above, cont oxygen     HTN. BP normal , hold BP medications.   Chronic neuropathy. Stable  Continue with gabapentin.    DVT prophylaxis: lovenox Code Status: DNR Family Communication: none at bedside.  Disposition Plan: discharge in 24 hours.   Consultants:   none   Procedures: none   Antimicrobials: none   Subjective: She is concern with some skin lesions. She saw her dermatologist, and had skin cancer removed. I advised her to follow with her dermatologist for further skin evaluation.  She is concern with color of her feet. She does not have any evidence of  cyanosis. I advised her to follow up with vascular. She has an appointment on thursday.  She think she is having more of panic attack, she gets SOB. She think is because she move recently from [place] to here.  \ Objective: Vitals:   05/07/17 0530 05/07/17 0748 05/07/17 1401 05/07/17 1503  BP: 117/66  120/74   Pulse: 84 91 86 97  Resp: 16 16 16 18   Temp: 98.1 F (36.7 C)  98.2 F (36.8 C)   TempSrc: Oral  Oral   SpO2: 98% 98% 98% 99%  Weight:      Height:        Intake/Output Summary (Last 24 hours) at 05/07/17 1546 Last data filed at 05/07/17 0905  Gross per 24 hour  Intake              300 ml  Output                0 ml  Net              300 ml   Filed Weights   05/05/17 1853  Weight: 102.5 kg (226 lb)    Examination:  General exam: Appears calm and comfortable  Respiratory system: Clear to auscultation. Respiratory effort normal. Cardiovascular system: S1 & S2 heard, RRR. No JVD, murmurs, rubs, gallops or clicks. No pedal edema. Gastrointestinal system: Abdomen is nondistended, soft and nontender. No organomegaly or masses felt. Normal bowel sounds heard. Central nervous system: Alert and oriented. No focal neurological deficits. Extremities: Symmetric 5  x 5 power. Skin: No rashes, lesions or ulcers Psychiatry: Judgement and insight appear normal. Mood & affect appropriate.     Data Reviewed: I have personally reviewed following labs and imaging studies  CBC:  Recent Labs Lab 05/05/17 1912  WBC 7.6  HGB 13.8  HCT 43.2  MCV 92.5  PLT 053   Basic Metabolic Panel:  Recent Labs Lab 05/05/17 1912  NA 136  K 3.8  CL 99*  CO2 27  GLUCOSE 105*  BUN 9  CREATININE 0.69  CALCIUM 9.6   GFR: Estimated Creatinine Clearance: 83.8 mL/min (by C-G formula based on SCr of 0.69 mg/dL). Liver Function Tests: No results for input(s): AST, ALT, ALKPHOS, BILITOT, PROT, ALBUMIN in the last 168 hours. No results for input(s): LIPASE, AMYLASE in the last 168  hours. No results for input(s): AMMONIA in the last 168 hours. Coagulation Profile: No results for input(s): INR, PROTIME in the last 168 hours. Cardiac Enzymes:  Recent Labs Lab 05/05/17 1912  TROPONINI <0.03   BNP (last 3 results) No results for input(s): PROBNP in the last 8760 hours. HbA1C: No results for input(s): HGBA1C in the last 72 hours. CBG: No results for input(s): GLUCAP in the last 168 hours. Lipid Profile: No results for input(s): CHOL, HDL, LDLCALC, TRIG, CHOLHDL, LDLDIRECT in the last 72 hours. Thyroid Function Tests:  Recent Labs  05/07/17 1016  TSH 0.794   Anemia Panel: No results for input(s): VITAMINB12, FOLATE, FERRITIN, TIBC, IRON, RETICCTPCT in the last 72 hours. Sepsis Labs: No results for input(s): PROCALCITON, LATICACIDVEN in the last 168 hours.  No results found for this or any previous visit (from the past 240 hour(s)).       Radiology Studies: Dg Chest 2 View  Result Date: 05/05/2017 CLINICAL DATA:  Wheezing and dyspnea for 1 week. Lower extremity swelling for 3 days. EXAM: CHEST  2 VIEW COMPARISON:  01/15/2016 FINDINGS: Moderate hyperinflation, unchanged. Calcified granulomatous changes, as well as mild chronic interstitial coarsening. No airspace consolidation. No effusions. Normal pulmonary vasculature. Normal heart size. Normal hilar and mediastinal contours, unchanged. IMPRESSION: Hyperinflation.  No consolidation or effusion. Electronically Signed   By: Andreas Newport M.D.   On: 05/05/2017 20:18        Scheduled Meds: . enoxaparin (LOVENOX) injection  40 mg Subcutaneous Q24H  . gabapentin  600 mg Oral QHS  . ipratropium-albuterol  3 mL Nebulization TID  . methylPREDNISolone (SOLU-MEDROL) injection  40 mg Intravenous Q12H  . polyethylene glycol  17 g Oral BID   Continuous Infusions:   LOS: 1 day    Time spent: 35 minutes.     Elmarie Shiley, MD [facility] Pager [phone]  If 7PM-7AM, please  contact night-coverage [email or web address] Password [facility] 05/07/2017, 3:46 PM

## 2017-05-07 NOTE — Evaluation (Signed)
Physical Therapy Evaluation Patient Details Name: Susan Frye MRN: [id number] DOB: [birth year] Today's Date: 05/07/2017   History of Present Illness  Susan Frye is a 71 y.o. female with PMH of HTN, Depression, COPD, Chronic respiratory failure on home oxygen, spinal stenosis is presented with progressive shortness of breath.   Clinical Impression  Pt admitted with above diagnosis. Pt currently with functional limitations due to the deficits listed below (see PT Problem List). Pt able to ambulate 100' on RA but O2 sats dropped to 84% and she required reapplication of 2L O2. On this, she was able to ambulate another 300'before seated rest and sats remained 92%, HR 100 bpm. At baseline she is able to ambulate further without her O2. Highly recommend that she return to pulmonary rehab.  Pt will benefit from skilled PT to increase their independence and safety with mobility to allow discharge to the venue listed below.       Follow Up Recommendations No PT follow up;Other (comment) (pulmonary rehab)    Equipment Recommendations  None recommended by PT    Recommendations for Other Services       Precautions / Restrictions Precautions Precautions: Other (comment) Precaution Comments: watch O2 sats and HR Restrictions Weight Bearing Restrictions: No      Mobility  Bed Mobility Overal bed mobility: Independent                Transfers Overall transfer level: Needs assistance Equipment used: 4-wheeled walker Transfers: Sit to/from Stand Sit to Stand: Supervision            Ambulation/Gait Ambulation/Gait assistance: Supervision Ambulation Distance (Feet): 500 Feet Assistive device: 4-wheeled walker Gait Pattern/deviations: Step-through pattern;Trunk flexed Gait velocity: decreased Gait velocity interpretation: Below normal speed for age/gender General Gait Details: needed 1 seated rest break at 400'. Began on RA, O2 sats dropped to 84% and DOE 3/4, needed to go  back onto 2L O2, sats maintained at 92% after that. After 100' ambulation, pt rests forearms on RW for mgmt of stenosis   Stairs            Wheelchair Mobility    Modified Rankin (Stroke Patients Only)       Balance Overall balance assessment: Needs assistance Sitting-balance support: No upper extremity supported Sitting balance-Leahy Scale: Good     Standing balance support: No upper extremity supported Standing balance-Leahy Scale: Good Standing balance comment: able to stand without RW and reach within BOS with no LOB                             Pertinent Vitals/Pain Pain Assessment: No/denies pain    Home Living Family/patient expects to be discharged to:: Private residence Living Arrangements: Alone Available Help at Discharge: Family;Available PRN/intermittently Type of Home: Apartment Home Access: Level entry     Home Layout: One level Home Equipment: Walker - 4 wheels Additional Comments: pt lives at [employer], walks to dining room for meals. Drives. Is on 2L O2 at home but uses intermittently. Was recently in pulmonary rehab    Prior Function Level of Independence: Independent with assistive device(s)         Comments: uses rollator for mgmt of LBP and energy conservation     Hand Dominance        Extremity/Trunk Assessment   Upper Extremity Assessment Upper Extremity Assessment: Overall WFL for tasks assessed    Lower Extremity Assessment Lower Extremity Assessment: Overall WFL for  tasks assessed    Cervical / Trunk Assessment Cervical / Trunk Assessment: Other exceptions Cervical / Trunk Exceptions: stenosis, lacking lordosis  Communication   Communication: No difficulties  Cognition Arousal/Alertness: Awake/alert Behavior During Therapy: WFL for tasks assessed/performed Overall Cognitive Status: Within Functional Limits for tasks assessed                                        General Comments  General comments (skin integrity, edema, etc.): discussed the need for her to return to pulmonary rehab. She reports that she was having trouble walking in from the deck, called down and found that she can have her car valet parked and they can push her to rehab in a w/c.     Exercises     Assessment/Plan    PT Assessment Patient needs continued PT services  PT Problem List Cardiopulmonary status limiting activity;Decreased activity tolerance       PT Treatment Interventions DME instruction;Gait training;Functional mobility training;Therapeutic activities;Therapeutic exercise;Balance training;Patient/family education    PT Goals (Current goals can be found in the Care Plan section)  Acute Rehab PT Goals Patient Stated Goal: get [facility] to check her home O2 unit PT Goal Formulation: With patient Time For Goal Achievement: 05/21/17 Potential to Achieve Goals: Good    Frequency Min 3X/week   Barriers to discharge        Co-evaluation               AM-PAC PT "6 Clicks" Daily Activity  Outcome Measure Difficulty turning over in bed (including adjusting bedclothes, sheets and blankets)?: None Difficulty moving from lying on back to sitting on the side of the bed? : None Difficulty sitting down on and standing up from a chair with arms (e.g., wheelchair, bedside commode, etc,.)?: None Help needed moving to and from a bed to chair (including a wheelchair)?: None Help needed walking in hospital room?: A Little Help needed climbing 3-5 steps with a railing? : A Little 6 Click Score: 22    End of Session Equipment Utilized During Treatment: Oxygen Activity Tolerance: Patient tolerated treatment well Patient left: in chair;with call bell/phone within reach Nurse Communication: Mobility status PT Visit Diagnosis: Difficulty in walking, not elsewhere classified (R26.2)    Time: 8309-4076 PT Time Calculation (min) (ACUTE ONLY): 41 min   Charges:   PT  Evaluation $PT Eval Moderate Complexity: 1 Mod PT Treatments $Gait Training: 23-37 mins   PT G Codes:        Susan Frye, PT  Acute Rehab Services  [place] 05/07/2017, 1:09 PM

## 2017-05-07 NOTE — Progress Notes (Addendum)
RT assessed pt and patient stated she takes neb treatments at home BID, patient is now on home regimen. Pt also wears 2LNC at home and is on [facility] currently. RT will continue to monitor.

## 2017-05-08 MED ORDER — POLYETHYLENE GLYCOL 3350 17 G PO PACK: 17.0000 g | PACK | Freq: Two times a day (BID) | ORAL | 0 refills | Status: DC

## 2017-05-08 MED ORDER — ALPRAZOLAM 0.25 MG PO TABS: 0.2500 mg | ORAL_TABLET | Freq: Every day | ORAL | 0 refills | Status: DC | PRN

## 2017-05-08 MED ORDER — ZOLPIDEM TARTRATE 5 MG PO TABS
5.0000 mg | ORAL_TABLET | Freq: Once | ORAL | Status: AC
  Administered 2017-05-08: 5 mg via ORAL
  Filled 2017-05-08: qty 1

## 2017-05-08 MED ORDER — PREDNISONE 20 MG PO TABS: 40.0000 mg | ORAL_TABLET | Freq: Every day | ORAL | 0 refills | Status: DC

## 2017-05-08 NOTE — Care Management Note (Signed)
Case Management Note  Patient Details  Name: Susan Frye MRN: [id number] Date of Birth: 1945-09-18  Subjective/Objective:                    Action/Plan:  Consulted by nurse for home oxygen and PCP.   Patient has PCP Kathryne Eriksson   Patient has home oxygen through [place] and states she is having issues with equipment at home . She has tried calling [facility] office and she was told they cannot service DME until Nov 15 . Spoke with hospital rep for [facility] Brunilda Payor he will come speak with patient at bedside.  Expected Discharge Date:  05/08/17               Expected Discharge Plan:  Home/Self Care  In-House Referral:     Discharge planning Services  CM Consult  Post Acute Care Choice:  Durable Medical Equipment Choice offered to:  Patient  DME Arranged:    DME Agency:     HH Arranged:    [place] Agency:     Status of Service:  Completed, signed off  If discussed at [employer] of Stay Meetings, dates discussed:    Additional Comments:  Marilu Favre, RN 05/08/2017, 1:35 PM

## 2017-05-08 NOTE — Discharge Summary (Signed)
Physician Discharge Summary  Susan Frye PYP:[id number] DOB: 09-07-1945 DOA: 05/05/2017  PCP: Christain [place], MD  Admit date: 05/05/2017 Discharge date: 05/08/2017  Admitted From: independent living.  Disposition:  Independent living   Recommendations for Outpatient Follow-up:  1. Follow up with PCP in 1-2 weeks 2. Please obtain BMP/CBC in one week 3. Needs to follow up with psychiatrist for anxiety and depression.     Discharge Condition: stable.  CODE STATUS: DNR Diet recommendation: Heart Healthy   Brief/Interim Summary: Susan Mitter Frobeenis a 71 y.o.femalewith PMH of HTN, Depression, COPD, Chronic respiratory failure on home oxygen, spinal stenosis is presented with progressive shortness of breath. Patient states that she has been more short of breath that usual for 1 week. She felt like it was due to humidity. She tried to use her home inhalers without much improvement and presented to emergency room for further evaluation. She usually on home oxygen at 2 lpm as needed and at night. Her family reported that patient has hyperventilating due to shortness of breath at times. She denies acute chest pains, no fevers, no productive cough, no orthopnea, or PNDs. -ED: patient received bronchodilator treatment, solumedrol, then found to have hypoxia at 85 % while walking. hospitalits is called for admission for COPD exacerbation    Assessment & Plan:   Active Problems:   Essential hypertension   COPD exacerbation (HCC)   Chronic respiratory failure with hypoxia (HCC)   Chronic back pain   1-Acute COPD exacerbation;  Continue with nebulizer, solumedrol.  Discharge on prednisone taper. Nebulizer.   2-Anxiety; depression Xanax PRN Needs to follow up with psych. She denies suicidal thought   Acute on chronic hypoxic respiratory failure due to COPD. Hypoxia->resolved with low flow oxygen. Cont as above, cont oxygen   HTN. BP normal , hold BP medications.   Chronic  neuropathy. Stable  Continue with gabapentin.   Chronic venous insufficiency; has appointment with vascular Thursday   Discharge Diagnoses:  Active Problems:   Essential hypertension   COPD exacerbation (HCC)   Chronic respiratory failure with hypoxia (HCC)   Chronic back pain    Discharge Instructions  Discharge Instructions    Diet - low sodium heart healthy    Complete by:  As directed    Increase activity slowly    Complete by:  As directed      Allergies as of 05/08/2017      Reactions   Ace Inhibitors    Cough      Medication List    STOP taking these medications   hydrochlorothiazide 25 MG tablet Commonly known as:  HYDRODIURIL     TAKE these medications   albuterol 108 (90 Base) MCG/ACT inhaler Commonly known as:  PROVENTIL HFA;VENTOLIN HFA Inhale 2 puffs into the lungs every 6 (six) hours as needed for wheezing or shortness of breath.   ALPRAZolam 0.25 MG tablet Commonly known as:  XANAX Take 1 tablet (0.25 mg total) by mouth daily as needed for anxiety.   ANORO ELLIPTA 62.5-25 MCG/INH Aepb Generic drug:  umeclidinium-vilanterol INHALE 1 PUFF INTO THE LUNGS ONCE DAILY   gabapentin 300 MG capsule Commonly known as:  NEURONTIN Take 600 mg by mouth at bedtime.   ipratropium-albuterol 0.5-2.5 (3) MG/3ML Soln Commonly known as:  DUONEB Take 3 mLs by nebulization 3 (three) times daily. Dx; copd  j44.9 What changed:  when to take this  reasons to take this  additional instructions   NORCO 10-325 MG tablet Generic drug:  HYDROcodone-acetaminophen  Take 1 tablet by mouth 3 (three) times daily as needed for moderate pain.   polyethylene glycol packet Commonly known as:  MIRALAX / GLYCOLAX Take 17 g by mouth 2 (two) times daily.   predniSONE 20 MG tablet Commonly known as:  DELTASONE Take 2 tablets (40 mg total) by mouth daily.       Allergies  Allergen Reactions  . Ace Inhibitors     Cough     Consultations:  none   Procedures/Studies: Dg Chest 2 View  Result Date: 05/05/2017 CLINICAL DATA:  Wheezing and dyspnea for 1 week. Lower extremity swelling for 3 days. EXAM: CHEST  2 VIEW COMPARISON:  01/15/2016 FINDINGS: Moderate hyperinflation, unchanged. Calcified granulomatous changes, as well as mild chronic interstitial coarsening. No airspace consolidation. No effusions. Normal pulmonary vasculature. Normal heart size. Normal hilar and mediastinal contours, unchanged. IMPRESSION: Hyperinflation.  No consolidation or effusion. Electronically Signed   By: Andreas Newport M.D.   On: 05/05/2017 20:18       Subjective: She is feeling better, dyspnea has improved.    Discharge Exam: Vitals:   05/08/17 0614 05/08/17 1022  BP: (!) 120/59   Pulse: 89   Resp: 20   Temp: 98 F (36.7 C)   SpO2: 99% 98%   Vitals:   05/07/17 1952 05/07/17 2111 05/08/17 0614 05/08/17 1022  BP:  (!) 110/52 (!) 120/59   Pulse: 85 86 89   Resp: 18 18 20    Temp:  98 F (36.7 C) 98 F (36.7 C)   TempSrc:  Oral Oral   SpO2: 98% 98% 99% 98%  Weight:      Height:        General: Pt is alert, awake, not in acute distress Cardiovascular: RRR, S1/S2 +, no rubs, no gallops Respiratory: CTA bilaterally, no wheezing, no rhonchi Abdominal: Soft, NT, ND, bowel sounds + Extremities: no edema, no cyanosis    The results of significant diagnostics from this hospitalization (including imaging, microbiology, ancillary and laboratory) are listed below for reference.     Microbiology: No results found for this or any previous visit (from the past 240 hour(s)).   Labs: BNP (last 3 results)  Recent Labs  05/05/17 1912  BNP 40.1   Basic Metabolic Panel:  Recent Labs Lab 05/05/17 1912  NA 136  K 3.8  CL 99*  CO2 27  GLUCOSE 105*  BUN 9  CREATININE 0.69  CALCIUM 9.6   Liver Function Tests: No results for input(s): AST, ALT, ALKPHOS, BILITOT, PROT, ALBUMIN in the last 168  hours. No results for input(s): LIPASE, AMYLASE in the last 168 hours. No results for input(s): AMMONIA in the last 168 hours. CBC:  Recent Labs Lab 05/05/17 1912  WBC 7.6  HGB 13.8  HCT 43.2  MCV 92.5  PLT 203   Cardiac Enzymes:  Recent Labs Lab 05/05/17 1912  TROPONINI <0.03   BNP: Invalid input(s): POCBNP CBG: No results for input(s): GLUCAP in the last 168 hours. D-Dimer No results for input(s): DDIMER in the last 72 hours. Hgb A1c No results for input(s): HGBA1C in the last 72 hours. Lipid Profile No results for input(s): CHOL, HDL, LDLCALC, TRIG, CHOLHDL, LDLDIRECT in the last 72 hours. Thyroid function studies  Recent Labs  05/07/17 1016  TSH 0.794   Anemia work up No results for input(s): VITAMINB12, FOLATE, FERRITIN, TIBC, IRON, RETICCTPCT in the last 72 hours. Urinalysis    Component Value Date/Time   COLORURINE YELLOW 05/06/2017 [place]  05/06/2017 0557   LABSPEC 1.010 05/06/2017 0557   PHURINE 5.0 05/06/2017 0557   GLUCOSEU NEGATIVE 05/06/2017 0557   HGBUR NEGATIVE 05/06/2017 [place] 05/06/2017 0557   KETONESUR 5 (A) 05/06/2017 0557   PROTEINUR NEGATIVE 05/06/2017 0557   NITRITE NEGATIVE 05/06/2017 0557   LEUKOCYTESUR SMALL (A) 05/06/2017 0557   Sepsis Labs Invalid input(s): PROCALCITONIN,  WBC,  LACTICIDVEN Microbiology No results found for this or any previous visit (from the past 240 hour(s)).   Time coordinating discharge: Over 30 minutes  SIGNED:   Elmarie Shiley, MD  [facility] 05/08/2017, 12:51 PM Pager   If 7PM-7AM, please contact night-coverage [email or web address] Password [email or web address]

## 2017-05-08 NOTE — Progress Notes (Signed)
Susan Frye to be D/C'd  per MD order. Discussed with the patient and all questions fully answered.  VSS, Skin clean, dry and intact without evidence of skin break down, no evidence of skin tears noted.  IV catheter discontinued intact. Site without signs and symptoms of complications. Dressing and pressure applied.  An After Visit Summary was printed and given to the patient. Patient received prescription.  D/c education completed with patient/family including follow up instructions, medication list, d/c activities limitations if indicated, with other d/c instructions as indicated by MD - patient able to verbalize understanding, all questions fully answered.   Patient instructed to return to ED, call 911, or call MD for any changes in condition.   Patient to be escorted via [place], and D/C home via private auto.

## 2017-05-17 ENCOUNTER — Ambulatory Visit (INDEPENDENT_AMBULATORY_CARE_PROVIDER_SITE_OTHER)
Admission: RE | Admit: 2017-05-17 | Discharge: 2017-05-17 | Disposition: A | Discharge status: Home / Self Care | Payer: Medicare Other | Source: Ambulatory Visit | Attending: Pulmonary Disease | Admitting: Pulmonary Disease

## 2017-05-17 ENCOUNTER — Encounter: Payer: Self-pay | Admitting: Pulmonary Disease

## 2017-05-17 ENCOUNTER — Ambulatory Visit (INDEPENDENT_AMBULATORY_CARE_PROVIDER_SITE_OTHER): Payer: Medicare Other | Admitting: Pulmonary Disease

## 2017-05-17 VITALS — BP 166/102 | HR 120 | Ht 72.0 in | Wt 227.0 lb

## 2017-05-17 DIAGNOSIS — R05 Cough: Secondary | ICD-10-CM | POA: Diagnosis not present

## 2017-05-17 DIAGNOSIS — J441 Chronic obstructive pulmonary disease with (acute) exacerbation: Secondary | ICD-10-CM | POA: Diagnosis not present

## 2017-05-17 DIAGNOSIS — R059 Cough, unspecified: Secondary | ICD-10-CM

## 2017-05-17 DIAGNOSIS — J9611 Chronic respiratory failure with hypoxia: Secondary | ICD-10-CM

## 2017-05-17 MED ORDER — HYDROCOD POLST-CPM POLST ER 10-8 MG/5ML PO SUER: 5.0000 mL | Freq: Two times a day (BID) | ORAL | 0 refills | Status: DC | PRN

## 2017-05-17 MED ORDER — BUDESONIDE 0.5 MG/2ML IN SUSP: 0.5000 mg | Freq: Two times a day (BID) | RESPIRATORY_TRACT | 5 refills | Status: DC

## 2017-05-17 MED ORDER — DOXYCYCLINE HYCLATE 100 MG PO TABS: 100.0000 mg | ORAL_TABLET | Freq: Two times a day (BID) | ORAL | 0 refills | Status: DC

## 2017-05-17 MED ORDER — PREDNISONE 20 MG PO TABS: 20.0000 mg | ORAL_TABLET | Freq: Every day | ORAL | 0 refills | Status: DC

## 2017-05-17 NOTE — Progress Notes (Signed)
Subjective:    Patient ID: Susan Frye, female    DOB: 1946-03-24, 71 y.o.   MRN: [id number]  Synopsis: Patient of Dr. Elsworth Soho with COPD.  HPI Chief Complaint  Patient presents with  . Acute Visit    Pt recently at [facility] for SOB, lighted headed. Pt is not breathing well today, wheezing postnasal drip for a week now, productive cough, clear mucus.    Susan Frye says that she was recently hospitalized for a COPD exacerbation.  She was discharged on 10/12 and she never really felt better.  However last night she developed a worsening cough and shortness of breath and wheezing.  She says that the oxygen doesn't seem like its helping her anymore.  She has been on oxygen now for two years, she sleeps with it at night.  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   .   She saw her PCP on Tuesday and she was put on an antibiotic for a UTI  She says that she never really felt great after leaving the hospital earlier this month.   She denies leg swelling.  No heart problems.    She is taking Anoro and she takes a nebulizer three times per day.  She is taking Albuterol periodically.  She is still taking some prednisone.    She has some heartburn and indigestion lately, too.   Past Medical History:  Diagnosis Date  . Arthritis   . Cancer ([place])    skin  . COPD (chronic obstructive pulmonary disease) ([place])   . Depression   . 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 Hypertension   . Spinal stenosis       Review of Systems  Constitutional: Positive for fatigue. Negative for chills and fever.  HENT: Negative for postnasal drip, rhinorrhea and sinus pain.   Respiratory: Positive for cough, shortness of breath and wheezing.   Cardiovascular: Negative for chest pain, palpitations and leg swelling.       Objective:   Physical Exam Vitals:   05/17/17 1556  BP: (!) 166/102  Pulse: (!) 120  SpO2: 92%  Weight: 227 lb (103 kg)    Height: 6' (1.829 m)   Repeat pulse on my exam 95 bpm  Gen: chronically ill appearing HENT: OP clear, TM's clear, neck supple PULM: Upper airway wheeze B, normal percussion CV: RRR, no mgr, trace edema GI: BS+, soft, nontender Derm: no cyanosis or rash Psyche: normal mood and affect    Records reviewed, she was recently hospitalized in October for a COPD exacerbation and treated with prednisone.  PFT from 2012 reviewed showing clear airflow obstruction, FEV1 52 % pred    Assessment & Plan:   COPD with acute exacerbation (HCC)  Chronic respiratory failure with hypoxia (HCC)  Cough  Discussion: She has a persistent exacerbation of her COPD.  I think that her cough is being exacerbated by cyclical cough or ongoing laryngeal irritation.  It is also exacerbated by GERD.  However, considering the severity of her COPD and her significant shortness of breath she needs to be treated for a COPD exacerbation.  We will check a chest x-ray to make sure there is no evidence of pulmonary edema or pneumonia.  She needs close monitoring, will have her come back in a week to make sure things are getting better.  If she gets worse she needs to go to the emergency room.  Plan: Acute exacerbation of COPD:  Check a chest x-ray to make sure there is no evidence of pneumonia Prednisone 20 mg daily times 5 days Doxycycline 100 mg twice a day times 5 days Tussionex twice a day as needed for cough We will add a prescription for pulmicort  Chronic respiratory failure with hypoxemia: Keep using oxygen as you are doing  Cough: For 3 days of like for you to do the following: Take pepcid every day for the next 10 days You need to try to suppress your cough to allow your larynx (voice box) to heal.  For three days don't talk, laugh, sing, or clear your throat. Do everything you can to suppress the cough during this time. Use hard candies (sugarless Jolly Ranchers) or non-mint or non-menthol containing  cough drops during this time to soothe your throat.  Use a cough suppressant (Delsym or what I have prescribed you) around the clock during this time.  After three days, gradually increase the use of your voice and back off on the cough suppressants.  Follow up in 7 days     Current Outpatient Prescriptions:  .  albuterol (PROVENTIL HFA;VENTOLIN HFA) 108 (90 BASE) MCG/ACT inhaler, Inhale 2 puffs into the lungs every 6 (six) hours as needed for wheezing or shortness of breath., Disp: , Rfl:  .  ALPRAZolam (XANAX) 0.25 MG tablet, Take 1 tablet (0.25 mg total) by mouth daily as needed for anxiety., Disp: 30 tablet, Rfl: 0 .  ANORO ELLIPTA 62.5-25 MCG/INH AEPB, INHALE 1 PUFF INTO THE LUNGS ONCE DAILY, Disp: 60 each, Rfl: 2 .  gabapentin (NEURONTIN) 300 MG capsule, Take 600 mg by mouth at bedtime., Disp: , Rfl:  .  HYDROcodone-acetaminophen (NORCO) 10-325 MG tablet, Take 1 tablet by mouth 3 (three) times daily as needed for moderate pain. , Disp: , Rfl:  .  ipratropium-albuterol (DUONEB) 0.5-2.5 (3) MG/3ML SOLN, Take 3 mLs by nebulization 3 (three) times daily. Dx; copd  j44.9 (Patient taking differently: Take 3 mLs by nebulization 3 (three) times daily as needed. Dx; copd  j44.9), Disp: 360 mL, Rfl: 0 .  polyethylene glycol (MIRALAX / GLYCOLAX) packet, Take 17 g by mouth 2 (two) times daily., Disp: 14 each, Rfl: 0 .  predniSONE (DELTASONE) 20 MG tablet, Take 2 tablets (40 mg total) by mouth daily., Disp: 15 tablet, Rfl: 0

## 2017-05-17 NOTE — Patient Instructions (Addendum)
Acute exacerbation of COPD: Check a chest x-ray to make sure there is no evidence of pneumonia Prednisone 20 mg daily times 5 days Doxycycline 100 mg twice a day times 5 days Tussionex twice a day as needed for cough We will add a prescription for pulmicort  Chronic respiratory failure with hypoxemia: Keep using oxygen as you are doing  Cough: For 3 days of like for you to do the following: Take pepcid every day for the next 10 days You need to try to suppress your cough to allow your larynx (voice box) to heal.  For three days don't talk, laugh, sing, or clear your throat. Do everything you can to suppress the cough during this time. Use hard candies (sugarless Jolly Ranchers) or non-mint or non-menthol containing cough drops during this time to soothe your throat.  Use a cough suppressant (Delsym or what I have prescribed you) around the clock during this time.  After three days, gradually increase the use of your voice and back off on the cough suppressants.  Follow up in 7 days

## 2017-05-18 ENCOUNTER — Ambulatory Visit: Payer: Self-pay | Admitting: Adult Health

## 2017-05-23 ENCOUNTER — Other Ambulatory Visit: Payer: Self-pay

## 2017-05-23 DIAGNOSIS — J441 Chronic obstructive pulmonary disease with (acute) exacerbation: Secondary | ICD-10-CM

## 2017-05-24 ENCOUNTER — Telehealth (HOSPITAL_COMMUNITY): Payer: Self-pay

## 2017-05-24 NOTE — Telephone Encounter (Signed)
Attempted to call patient in regards to Pulmonary Rehab. LMTCB °

## 2017-05-28 ENCOUNTER — Ambulatory Visit: Payer: Self-pay | Admitting: Adult Health

## 2017-05-31 NOTE — Telephone Encounter (Signed)
Attempted to call patient in regards to Pulmonary Rehab - LMCTB. Sent letter.

## 2017-06-07 ENCOUNTER — Telehealth (HOSPITAL_COMMUNITY): Payer: Self-pay

## 2017-06-07 NOTE — Telephone Encounter (Signed)
Patient called in regards to Pulmonary Rehab - Patient stated she has fallen flat on her face, tried to get back up and fell again. Patient is currently in the hospital and doing inpatient rehab. She stated that she will most likely go home on Saturday. She will then give [place] a call.

## 2017-06-07 NOTE — Telephone Encounter (Signed)
3rd attempt to call patient in regards to Pulmonary Rehab - Lm on Vm °

## 2017-06-08 ENCOUNTER — Ambulatory Visit: Payer: Self-pay | Admitting: Pulmonary Disease

## 2017-06-18 ENCOUNTER — Telehealth (HOSPITAL_COMMUNITY): Payer: Self-pay

## 2017-06-18 NOTE — Telephone Encounter (Signed)
Attempted to call patient to follow up in regards to her recent hospital encounter - Son answered and stated patient was with someone right now and would have her return phone call.

## 2017-06-19 ENCOUNTER — Telehealth (HOSPITAL_COMMUNITY): Payer: Self-pay

## 2017-06-19 NOTE — Telephone Encounter (Signed)
Closing referral. Patient has not responded to phone calls.

## 2017-06-23 ENCOUNTER — Other Ambulatory Visit: Payer: Self-pay | Admitting: Pulmonary Disease

## 2017-06-25 ENCOUNTER — Telehealth (HOSPITAL_COMMUNITY): Payer: Self-pay

## 2017-06-25 DIAGNOSIS — F331 Major depressive disorder, recurrent, moderate: Secondary | ICD-10-CM

## 2017-06-25 NOTE — Telephone Encounter (Signed)
Patient is calling for a refill on her Remeron, she was in the hospital for almost a month. Patient has a follow up now, but it is not until February. The medication is no longer on her medication list, but she said the hospital was giving it to her. Please review and advise, thank you

## 2017-06-25 NOTE — Telephone Encounter (Signed)
That's fine to send in 90 days

## 2017-06-26 ENCOUNTER — Other Ambulatory Visit: Payer: Self-pay | Admitting: Family Medicine

## 2017-06-26 ENCOUNTER — Ambulatory Visit
Admission: RE | Admit: 2017-06-26 | Discharge: 2017-06-26 | Disposition: A | Discharge status: Home / Self Care | Payer: Medicare Other | Source: Ambulatory Visit | Attending: Family Medicine | Admitting: Family Medicine

## 2017-06-26 DIAGNOSIS — J189 Pneumonia, unspecified organism: Secondary | ICD-10-CM

## 2017-06-26 MED ORDER — MIRTAZAPINE 15 MG PO TABS: 15.0000 mg | ORAL_TABLET | Freq: Every day | ORAL | 0 refills | Status: DC

## 2017-06-26 NOTE — Telephone Encounter (Signed)
Called patient and let her know her prescription was sent in. Patient thanked me and confirmed her follow up appointment

## 2017-06-28 ENCOUNTER — Ambulatory Visit (HOSPITAL_COMMUNITY): Payer: Medicare Other | Admitting: Psychiatry

## 2017-07-11 ENCOUNTER — Other Ambulatory Visit: Payer: Self-pay | Admitting: Pulmonary Disease

## 2017-07-18 ENCOUNTER — Telehealth: Payer: Self-pay | Admitting: Pulmonary Disease

## 2017-07-18 NOTE — Telephone Encounter (Signed)
Left message for patient to call back. I searched patient's chart and my results to see if anyone had called her, I did not see anything.

## 2017-07-20 NOTE — Telephone Encounter (Signed)
I had called pt to give her CT appt info.  Called her again today & left vm for her to call me back.

## 2017-07-20 NOTE — Telephone Encounter (Signed)
Spoke to pt & gave her appt info.  She wanted to reschedule to different date.  I transferred her to [place] at [facility] to reschedule.  Nothing further needed.

## 2017-07-20 NOTE — Telephone Encounter (Signed)
PCC's did you guys call her? I see that Judson Roch ordered a ct? Please advise thanks

## 2017-07-25 ENCOUNTER — Telehealth: Payer: Self-pay | Admitting: Pulmonary Disease

## 2017-07-25 NOTE — Telephone Encounter (Signed)
Spoke with pt asking if she had any symptoms and she stated that she did not. She is worried about getting something knowing that her grandson has croup.  Told pt to try to avoid her grandson if she can to keep from catching something. Also told pt that if she does develop symptoms for her to call our office back.  Pt expressed understanding. Nothing further needed.

## 2017-07-30 ENCOUNTER — Other Ambulatory Visit: Payer: Self-pay | Admitting: Neurology

## 2017-08-09 ENCOUNTER — Ambulatory Visit: Payer: Self-pay

## 2017-08-27 ENCOUNTER — Inpatient Hospital Stay: Admission: RE | Admit: 2017-08-27 | Payer: Self-pay | Source: Ambulatory Visit

## 2017-08-29 ENCOUNTER — Other Ambulatory Visit: Payer: Self-pay | Admitting: Pulmonary Disease

## 2017-08-30 ENCOUNTER — Telehealth: Payer: Self-pay | Admitting: Pulmonary Disease

## 2017-08-30 MED ORDER — ALBUTEROL SULFATE (2.5 MG/3ML) 0.083% IN NEBU: INHALATION_SOLUTION | RESPIRATORY_TRACT | 0 refills | Status: AC

## 2017-08-30 MED ORDER — ALBUTEROL SULFATE (2.5 MG/3ML) 0.083% IN NEBU: INHALATION_SOLUTION | RESPIRATORY_TRACT | 0 refills | Status: DC

## 2017-08-30 NOTE — Telephone Encounter (Signed)
Rx sent 2.6.19 with note that pt is due for appt ICD-10 code was not included in the Rx >> J44.9 Rx resent with the diagnosis code and called [facility] to make them aware  Spoke with pharmacy tech [place] who reported that the pharmacist stated that a hard copy Rx needs to be faxed in with the diagnosis code.  Jerilynn Mages that typically we are able to send the Rx electronically but Becky reiterated that pharmacist stated hard copy Rx needs to be faxed in  RA not back in the office until Monday 2.11.19 SG is not in the office who saw the patient 8.13.18  Called spoke with patient to discuss Patient only has 1 vial left and typically uses her albuterol neb TID as needed Pt stated she is okay to wait for the Rx to be signed tomorrow by SG Appt scheduled with RA for follow up on 3.11.19 @ 3pm (pt unable to come sooner because she relies on her daughter-in-law for transportation)  Rx reprinted for SG to sign and given to [facility]

## 2017-08-31 NOTE — Telephone Encounter (Signed)
SG signed and faxed. Nothing further needed.

## 2017-09-12 ENCOUNTER — Ambulatory Visit (HOSPITAL_COMMUNITY): Payer: Self-pay | Admitting: Psychiatry

## 2017-10-01 ENCOUNTER — Ambulatory Visit: Payer: Self-pay | Admitting: Pulmonary Disease

## 2017-10-08 ENCOUNTER — Ambulatory Visit (INDEPENDENT_AMBULATORY_CARE_PROVIDER_SITE_OTHER): Payer: Medicare Other | Admitting: Pulmonary Disease

## 2017-10-08 ENCOUNTER — Encounter: Payer: Self-pay | Admitting: Pulmonary Disease

## 2017-10-08 DIAGNOSIS — J441 Chronic obstructive pulmonary disease with (acute) exacerbation: Secondary | ICD-10-CM | POA: Diagnosis not present

## 2017-10-08 DIAGNOSIS — J449 Chronic obstructive pulmonary disease, unspecified: Secondary | ICD-10-CM

## 2017-10-08 DIAGNOSIS — R911 Solitary pulmonary nodule: Secondary | ICD-10-CM | POA: Diagnosis not present

## 2017-10-08 MED ORDER — FUROSEMIDE 20 MG PO TABS: ORAL_TABLET | ORAL | 0 refills | Status: DC

## 2017-10-08 MED ORDER — FLUTICASONE-UMECLIDIN-VILANT 100-62.5-25 MCG/INH IN AEPB: 1.0000 | INHALATION_SPRAY | Freq: Every day | RESPIRATORY_TRACT | 0 refills | Status: DC

## 2017-10-08 MED ORDER — DOXYCYCLINE HYCLATE 100 MG PO TABS: 100.0000 mg | ORAL_TABLET | Freq: Two times a day (BID) | ORAL | 0 refills | Status: DC

## 2017-10-08 MED ORDER — PREDNISONE 10 MG PO TABS: ORAL_TABLET | ORAL | 0 refills | Status: DC

## 2017-10-08 NOTE — Assessment & Plan Note (Signed)
Follow-up screening once acute issues resolved

## 2017-10-08 NOTE — Assessment & Plan Note (Signed)
Stop taking ANORO -Start taking sample of trilogy once daily, rinse mouth after use, call back for prescription if this works OR  let us know if this does not work-  Will need pulmonary rehab eventually

## 2017-10-08 NOTE — Assessment & Plan Note (Signed)
Doxycycline 100 mg twice daily for 7 days. Lasix 20 mg daily for 3 days. Prednisone 10 mg tabs Take 4 tabs  daily with food x 4 days, then 3 tabs daily x 4 days, then 2 tabs daily x 4 days, then 1 tab daily x4 days then stop. #40

## 2017-10-08 NOTE — Patient Instructions (Addendum)
Stop taking ANORO -Start taking sample of trilogy once daily, rinse mouth after use, call back for prescription if this works OR  let us know if this does not work-   Doxycycline 100 mg twice daily for 7 days. Lasix 20 mg daily for 3 days. Prednisone 10 mg tabs Take 4 tabs  daily with food x 4 days, then 3 tabs daily x 4 days, then 2 tabs daily x 4 days, then 1 tab daily x4 days then stop. #40

## 2017-10-08 NOTE — Progress Notes (Signed)
   Subjective:    Patient ID: Susan Frye, female    DOB: 1946-07-18, 72 y.o.   MRN: [id number]  HPI  50 yowf with gold C COPD ,quit smoking 03/2004  for followup of dyspnea.  She reports chronic back pain due to spinal stenosis.   She had several hospitalizations since her last visit starting with 04/2017 at [facility] for COPD flare. Within 2 weeks she was admitted to [facility] for another flare, then discharged home and had a fall due to tripping and was rehospitalized. She then developed left lower extremity pain which was attributed to arthritis, EGD showed hiatal hernia.  Over the weekend she developed a chest cold with increased wheezing and yellow sputum and was given 20 mg of prednisone which she has taken for 3 days with some improvement but still has wheezing.  Continues to have yellow sputum  She moved in with her son last October due to repeated flareups and is accompanied by her daughter-in-law Susan Frye today. Depression continues to be an issue.  She has now limited has not been able to drive for the last 5 months Could not tell a difference with breo - spiriva helped, Anoro not helpful, Pulmicort on her list but she is not taking   She is compliant with oxygen Significant tests/ events PFT's 12/10/03 FEV1 1.12 (36%) ratio 49  - PFT's 05/31/2011 1.31 (46%) ratio 50 and 13% p B2 with DLC0 40%  Spirometry 04/2013 - unchanged FEV1 at 1.28-44% and FVC of 2.09-53% with ratio 61.   Low risk myocardial perfusion scan 2014   12/2013 Acute OV ,D-dimer negative, CT angio neg   CT screen 03/2016 , 07/2016 >> RADS 4A, LLL nodule, old granulomatous disease - calcified hilar LNs + spleen   Review of Systems  Pedal edema for the last 5 days  neg for any significant sore throat, dysphagia, itching, sneezing, nasal congestion or excess/ purulent secretions, fever, chills, sweats, unintended wt loss, pleuritic or exertional cp, hempoptysis, orthopnea pnd   Also denies  presyncope, palpitations, heartburn, abdominal pain, nausea, vomiting, diarrhea or change in bowel or urinary habits, dysuria,hematuria, rash, arthralgias, visual complaints, headache, numbness weakness or ataxia.     Objective:   Physical Exam  Gen. Pleasant, well-nourished, in no distress, anxious affect ENT - no lesions, no post nasal drip Neck: No JVD, no thyromegaly, no carotid bruits Lungs: no use of accessory muscles, no dullness to percussion, faint exp scattered rhonchi  Cardiovascular: Rhythm regular, heart sounds  normal, no murmurs or gallops, 1+ peripheral edema Abdomen: soft and non-tender, no hepatosplenomegaly, BS normal. Musculoskeletal: No deformities, no cyanosis or clubbing Neuro:  alert, non focal       Assessment & Plan:

## 2017-10-17 ENCOUNTER — Telehealth: Payer: Self-pay | Admitting: Pulmonary Disease

## 2017-10-17 MED ORDER — FLUTICASONE-UMECLIDIN-VILANT 100-62.5-25 MCG/INH IN AEPB: 1.0000 | INHALATION_SPRAY | Freq: Every day | RESPIRATORY_TRACT | 5 refills | Status: DC

## 2017-10-17 NOTE — Telephone Encounter (Signed)
Sample given was Trelegy and this is what she would like RX for.

## 2017-10-17 NOTE — Telephone Encounter (Signed)
Spoke with pt. She is requesting a prescription for Trelegy. Sample has worked well for her. While on the phone she asked if she could take Mucinex for the phlegm she has in her chest and throat. Advised her that this would be fine. Nothing further was needed.

## 2017-10-29 ENCOUNTER — Encounter: Payer: Self-pay | Admitting: Adult Health

## 2017-10-29 ENCOUNTER — Ambulatory Visit (INDEPENDENT_AMBULATORY_CARE_PROVIDER_SITE_OTHER): Payer: Medicare Other | Admitting: Adult Health

## 2017-10-29 ENCOUNTER — Other Ambulatory Visit (INDEPENDENT_AMBULATORY_CARE_PROVIDER_SITE_OTHER): Payer: Medicare Other

## 2017-10-29 VITALS — BP 126/66 | HR 102 | Ht 72.0 in | Wt 219.0 lb

## 2017-10-29 DIAGNOSIS — J9611 Chronic respiratory failure with hypoxia: Secondary | ICD-10-CM

## 2017-10-29 DIAGNOSIS — R3915 Urgency of urination: Secondary | ICD-10-CM

## 2017-10-29 DIAGNOSIS — J441 Chronic obstructive pulmonary disease with (acute) exacerbation: Secondary | ICD-10-CM

## 2017-10-29 LAB — URINALYSIS, ROUTINE W REFLEX MICROSCOPIC
HGB URINE DIPSTICK: NEGATIVE
NITRITE: NEGATIVE
PH: 6 (ref 5.0–8.0)
Specific Gravity, Urine: 1.025 (ref 1.000–1.030)
Total Protein, Urine: NEGATIVE
Urine Glucose: NEGATIVE
Urobilinogen, UA: 1 (ref 0.0–1.0)

## 2017-10-29 MED ORDER — BENZONATATE 200 MG PO CAPS: 200.0000 mg | ORAL_CAPSULE | Freq: Three times a day (TID) | ORAL | 1 refills | Status: DC | PRN

## 2017-10-29 MED ORDER — FLUTICASONE-UMECLIDIN-VILANT 100-62.5-25 MCG/INH IN AEPB: 1.0000 | INHALATION_SPRAY | Freq: Every day | RESPIRATORY_TRACT | 0 refills | Status: DC

## 2017-10-29 NOTE — Assessment & Plan Note (Signed)
Cont on O2 .  

## 2017-10-29 NOTE — Progress Notes (Signed)
@Patient  ID: Susan Frye, female    DOB: Jun 07, 1946, 72 y.o.   MRN: [id number]  Chief Complaint  Patient presents with  . Follow-up    COPD     Referring provider: Christain Sacramento, MD  HPI: 72 yo female , former smoker ,GOLD C COPD and chronic respiratory failure on O2  . PMH : Spinal Stenosis   Significant tests/ events PFT's 12/10/03 FEV1 1.12 (36%) ratio 49  - PFT's 05/31/2011 1.31 (46%) ratio 50 and 13% p B2 with DLC0 40%  Spirometry 04/2013 - unchanged FEV1 at 1.28-44% and FVC of 2.09-53% with ratio 61.   Low risk myocardial perfusion scan 2014   12/2013 Acute OV ,D-dimer negative, CT angio neg   CT screen 03/2016 , 07/2016 >> RADS 4A, LLL nodule, old granulomatous disease - calcified hilar LNs + spleen  10/29/2017 Follow up ; COPD , O2 RF  Patient returns for a 2-week follow-up from recent COPD exacerbation.  She was treated with doxycycline and a prednisone taper.  And change from [facility] to [facility] .  She says she is feeling better.  Cough and congestion have decreased.  She does feel that her new inhaler is working better.  She denies any flare of cough or wheezing.  Patient is looking to move to [place] with her daughter..  She remains on oxygen 2 L.  Says that she feels that her oxygen helps with her shortness of breath.  Complains over the last week that she has had urinary urgency and mild dysuria.  Would like to be checked for urinary tract infection.  She denies any back pain, nausea vomiting, abdominal pain or fever.  Allergies  Allergen Reactions  . Ace Inhibitors     Cough    Immunization History  Administered Date(s) Administered  . Influenza, High Dose Seasonal PF 04/23/2017  . Influenza,inj,Quad PF,6+ Mos 04/24/2013, 04/24/2014, 06/23/2015, 03/20/2016  . Pneumococcal Conjugate-13 05/24/2014  . Zoster 05/24/2014    Past Medical History:  Diagnosis Date  . Arthritis   . Cancer ([place])    skin  . COPD (chronic obstructive pulmonary disease)  ([place])   . Depression   . 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 Hypertension   . Spinal stenosis     Tobacco History: Social History   Tobacco Use  Smoking Status Former Smoker  . Packs/day: 2.00  . Years: 39.00  . Pack years: 78.00  . Types: Cigarettes  . Last attempt to quit: 04/03/2004  . Years since quitting: 13.5  Smokeless Tobacco Never Used   Counseling given: Not Answered   Outpatient Encounter Medications as of 10/29/2017  Medication Sig  . albuterol (PROVENTIL HFA;VENTOLIN HFA) 108 (90 BASE) MCG/ACT inhaler Inhale 2 puffs into the lungs every 6 (six) hours as needed for wheezing or shortness of breath.  Marland Kitchen albuterol (PROVENTIL) (2.5 MG/3ML) 0.083% nebulizer solution USE 1 VIAL EVERY 4 HOURS AS NEEDED FOR SHORTNESS OF BREATH  Dx: J44.9  . ALPRAZolam (XANAX) 0.25 MG tablet Take 1 tablet (0.25 mg total) by mouth daily as needed for anxiety.  . Fluticasone-Umeclidin-Vilant (TRELEGY ELLIPTA) 100-62.5-25 MCG/INH AEPB Inhale 1 puff into the lungs daily at 8 pm.  . gabapentin (NEURONTIN) 300 MG capsule Take 600 mg by mouth at bedtime.  Marland Kitchen HYDROcodone-acetaminophen (NORCO) 10-325 MG tablet Take 1 tablet by mouth 3 (three) times daily as needed for moderate pain.   Marland Kitchen ipratropium-albuterol (DUONEB) 0.5-2.5 (3) MG/3ML SOLN Take 3 mLs by nebulization 3 (three) times daily. Dx;  copd  j44.9 (Patient taking differently: Take 3 mLs by nebulization 3 (three) times daily as needed. Dx; copd  j44.9)  . mirtazapine (REMERON) 15 MG tablet Take 1 tablet (15 mg total) by mouth at bedtime.  . sertraline (ZOLOFT) 50 MG tablet Take one and a half pill daily  . benzonatate (TESSALON) 200 MG capsule Take 1 capsule (200 mg total) by mouth 3 (three) times daily as needed for cough.  . Fluticasone-Umeclidin-Vilant (TRELEGY ELLIPTA) 100-62.5-25 MCG/INH AEPB Inhale 1 puff into the lungs daily.  . polyethylene glycol (MIRALAX / GLYCOLAX) packet Take 17 g by mouth 2 (two) times daily.  (Patient not taking: Reported on 10/29/2017)  . [DISCONTINUED] doxycycline (VIBRA-TABS) 100 MG tablet Take 1 tablet (100 mg total) by mouth 2 (two) times daily. (Patient not taking: Reported on 10/29/2017)  . [DISCONTINUED] furosemide (LASIX) 20 MG tablet Take 1 tablet daily for the next 3 days. (Patient not taking: Reported on 10/29/2017)  . [DISCONTINUED] predniSONE (DELTASONE) 10 MG tablet Take 4 tabs by mouth once daily x4 days, then 3 tabs x4 days, 2 tabs x4 days, 1 tab x4 days and stop. (Patient not taking: Reported on 10/29/2017)  . [DISCONTINUED] predniSONE (DELTASONE) 20 MG tablet Take 1 tablet (20 mg total) by mouth daily with breakfast. (Patient not taking: Reported on 10/29/2017)   No facility-administered encounter medications on file as of 10/29/2017.      Review of Systems  Constitutional:   No  weight loss, night sweats,  Fevers, chills, fatigue, or  lassitude.  HEENT:   No headaches,  Difficulty swallowing,  Tooth/dental problems, or  Sore throat,                No sneezing, itching, ear ache, nasal congestion, post nasal drip,   CV:  No chest pain,  Orthopnea, PND, swelling in lower extremities, anasarca, dizziness, palpitations, syncope.   GI  No heartburn, indigestion, abdominal pain, nausea, vomiting, diarrhea, change in bowel habits, loss of appetite, bloody stools.   Resp: No shortness of breath with exertion or at rest.  No excess mucus, no productive cough,  No non-productive cough,  No coughing up of blood.  No change in color of mucus.  No wheezing.  No chest wall deformity  Skin: no rash or lesions.  GU: no dysuria, change in color of urine, no urgency or frequency.  No flank pain, no hematuria   MS:  No joint pain or swelling.  No decreased range of motion.  No back pain.    Physical Exam  BP 126/66 (BP Location: Left Arm, Cuff Size: Normal)   Pulse (!) 102   Ht 6' (1.829 m)   Wt 219 lb (99.3 kg)   SpO2 94%   BMI 29.70 kg/m   GEN: A/Ox3; pleasant , NAD, obese  , On O2    HEENT:  [place]/AT,  EACs-clear, TMs-wnl, NOSE-clear, THROAT-clear, no lesions, no postnasal drip or exudate noted.   NECK:  Supple w/ fair ROM; no JVD; normal carotid impulses w/o bruits; no thyromegaly or nodules palpated; no lymphadenopathy.    RESP  Decreased BS in bases ,  no accessory muscle use, no dullness to percussion  CARD:  RRR, no m/r/g, tr peripheral edema, pulses intact, no cyanosis or clubbing.  GI:   Soft & nt; nml bowel sounds; no organomegaly or masses detected.   Musco: Warm bil, no deformities or joint swelling noted.   Neuro: alert, no focal deficits noted.    Skin: Warm, no lesions  or rashes    Lab Results:  CBC  BMET  Imaging: No results found.   Assessment & Plan:   COPD exacerbation ([place]) Recent flare now resolved after abx and steroids   Plan  Patient Instructions  Continue on TRELEGY , rinse after use.  Mucinex DM Twice daily  As needed  Cough/congestion  Tessalon Three times a day  As needed  Cough .  Check with Dr. Redmond Pulling to make sure you have had your Pneumovax.  Continue on Oxygen 2l/m .  Follow up with Dr. Elsworth Soho  In 3-4 months and As needed      Chronic respiratory failure with hypoxia ([place]) Cont on O2      Keamber Macfadden, NP 10/29/2017

## 2017-10-29 NOTE — Assessment & Plan Note (Signed)
Recent flare now resolved after abx and steroids   Plan  Patient Instructions  Continue on TRELEGY , rinse after use.  Mucinex DM Twice daily  As needed  Cough/congestion  Tessalon Three times a day  As needed  Cough .  Check with Dr. Redmond Pulling to make sure you have had your Pneumovax.  Continue on Oxygen 2l/m .  Follow up with Dr. Elsworth Soho  In 3-4 months and As needed

## 2017-10-29 NOTE — Patient Instructions (Signed)
Continue on TRELEGY , rinse after use.  Mucinex DM Twice daily  As needed  Cough/congestion  Tessalon Three times a day  As needed  Cough .  Check with Dr. Redmond Pulling to make sure you have had your Pneumovax.  Continue on Oxygen 2l/m .  Follow up with Dr. Elsworth Soho  In 3-4 months and As needed

## 2017-10-30 LAB — URINE CULTURE
MICRO NUMBER:: 90429717
SPECIMEN QUALITY:: ADEQUATE

## 2017-10-31 ENCOUNTER — Telehealth: Payer: Self-pay | Admitting: Adult Health

## 2017-10-31 NOTE — Telephone Encounter (Signed)
Called and spoke with patient, advised her of the results. Nothing further is needed at this time.

## 2017-10-31 NOTE — Telephone Encounter (Signed)
Notes recorded by Melvenia Needles, NP on 10/31/2017 at 8:57 AM EDT No sign of UTI If still having sx will need to be seen by PCP  Please contact office for sooner follow up if symptoms do not improve or worsen or seek emergency care  --------------------------------------- Attempted to call the pt. I did not receive an answer. I have left a message for the pt to return our call.

## 2017-10-31 NOTE — Telephone Encounter (Signed)
Pt is returning call. Cb is [phone]

## 2017-11-07 NOTE — Progress Notes (Signed)
Reviewed & agree with plan  

## 2017-11-27 ENCOUNTER — Ambulatory Visit: Payer: Self-pay | Admitting: Pulmonary Disease

## 2017-12-27 NOTE — Progress Notes (Signed)
Psychiatric Initial Adult Assessment   Patient Identification: Susan Frye MRN:  [id number] Date of Evaluation:  12/29/2017 Referral Source: Dr. Kathryne Eriksson Chief Complaint:  "I shouldn't have moved in." Visit Diagnosis:    ICD-10-CM   1. Mild episode of recurrent major depressive disorder ([place]) F33.0     History of Present Illness:   Susan Frye is a 72 y.o. year old female with a history of depression, COPD, who is referred for depression.  Per chart review, she was seen by DR. Eksir in 12/2016 for depression in the setting of multiple family members and her husband of 35 years passing away over the past 3 years. Plan includes initiate Remeron 15 mg, uptitrate Wellbutrin 300 mg, taper off ativan.   She states that she could not make it to the appointment with Dr. Daron Offer as she was in the hospital. She was admitted many times since last year; for COPD in  Nov 2018 ,  Had a fall and was admitted in Nov, and had another admission in March 2019 for worsening shortness of breath. She has started to live with her son and her daughter in law since January as it has been more difficult for her to take care of herself. She would have shortness of breath when she tries to go to bathroom or change clothes. She feels very stressed at home. Her daughter in law screams and yells at her, although she tries to "keep my mouth shut." Although she talked with her son, her son heard from his wife that Susan Frye got into her nerve. She had SI of overdosing medication a few months ago as she was very overwhelmed by this screaming. She did not attempt on it and denies any SI since then, stating that she has a daughter. Susan Frye believes that it is the best for her to leave and live in assisted living. She does not think she can live by herself as she has significant anxiety when she has shortness of breath. Although her daughter invites Susan Frye to live together, Susan Frye does not think that it is a good idea now that her daughter has  started relationship. She enjoys having her great grandson, age 85, who visits their place twice a week. She stays home most of the time, which she partially attribute to her shortness of breath. 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 She enjoyed her job and felt "needed." She agrees that it is a big transition from her contributing something to living at other people's place. She talks about losses of people; her husband in 2014, sister in 63, her friend in 2016. She also states that her lung condition worsened in 2017. She feels that grief of her loss has become less intense.   She has initial insomnia. She feels fatigue. She has fair concentration. She has mild anhedonia. She denies SI. She feels anxious and tense at times. She had a panic attacks a few weeks ago when she was worried about her daughter's possible marriage. She took xanax 0.25 mg prior to coming to this visit, although she rarely takes this medication. She drinks a beer occasionally. She denies drug use except hemp oil twice for pain, which was recommended by her family. She does not like mirtazapine as it caused constipation. She has been on duloxetine for foot pain. She does not remember why she is not taking Wellbutrin anymore. (She also does not remember medication change made  by Dr. Daron Offer).   Associated Signs/Symptoms: Depression Symptoms:  depressed mood, anhedonia, insomnia, fatigue, (Hypo) Manic Symptoms:  denies decreased need for sleep, euphoria Anxiety Symptoms:  Panic Symptoms, Psychotic Symptoms:  denies AH, VH PTSD Symptoms: Negative  Past Psychiatric History:  Outpatient: DR. Daron Offer in 2018 Psychiatry admission: denies Previous suicide attempt: denies Past trials of medication: Paxil, mirtazapine History of violence: denies  Previous Psychotropic Medications: Yes   Substance Abuse History in the last 12 months:  No.  Consequences of Substance  Abuse: NA  Past Medical History:  Past Medical History:  Diagnosis Date  . Arthritis   . Cancer ([place])    skin  . COPD (chronic obstructive pulmonary disease) ([place])   . Depression   . 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 Hypertension   . Spinal stenosis     Past Surgical History:  Procedure Laterality Date  . COSMETIC SURGERY     eye cosmetic surgery    Family Psychiatric History:  Sister's son uses meth  Family History:  Family History  Problem Relation Age of Onset  . Emphysema Mother        smoked  . Lung cancer Mother        smoked  . Varicose Veins Mother   . Emphysema Father        smoked  . Heart disease Father   . Diabetes Father   . Hyperlipidemia Father   . Hypertension Father   . Heart attack Father   . Cancer - Prostate Father   . Asthma Sister   . Diabetes Sister   . Heart disease Sister   . Diabetes Brother   . Hypertension Brother   . Heart attack Daughter   . Cancer Son     Social History:   Social History   Socioeconomic History  . Marital status: Widowed    Spouse name: Not on file  . Number of children: 2  . Years of education: 51  . Highest education level: Not on file  Occupational History  . Occupation: Retired    Comment: Facilities manager Needs  . Financial resource strain: Not hard at all  . Food insecurity:    Worry: Never true    Inability: Never true  . Transportation needs:    Medical: No    Non-medical: No  Tobacco Use  . Smoking status: Former Smoker    Packs/day: 2.00    Years: 39.00    Pack years: 78.00    Types: Cigarettes    Last attempt to quit: 04/03/2004    Years since quitting: 13.7  . Smokeless tobacco: Never Used  Substance and Sexual Activity  . Alcohol use: Yes    Alcohol/week: 2.4 oz    Types: 4 Cans of beer per week    Comment: Social  . Drug use: No  . Sexual activity: Not on file  Lifestyle  . Physical activity:    Days per week: 0 days    Minutes per session: 0  min  . Stress: Very much  Relationships  . Social connections:    Talks on phone: More than three times a week    Gets together: More than three times a week    Attends religious service: Never    Active member of club or organization: No    Attends meetings of clubs or organizations: Never    Relationship status: Widowed  Other Topics Concern  . Not on file  Social History Narrative  Lives at home, widowed   Right-handed   Caffeine: 4 cups per day    Additional Social History:  She lives with her son and daughter in law Retired, used to work as Freight forwarder at [employer] for 16 years  Her husband deceased in 11-10-12. She has two children. She grew up in [place]. She moved from [place] from [place] years ago to [facility] for her husband's job.   Allergies:   Allergies  Allergen Reactions  . Ace Inhibitors     Cough    Metabolic Disorder Labs: Lab Results  Component Value Date   HGBA1C 5.8 (H) 06/26/2015   MPG 120 06/26/2015   No results found for: PROLACTIN No results found for: CHOL, TRIG, HDL, CHOLHDL, VLDL, LDLCALC   Current Medications: Current Outpatient Medications  Medication Sig Dispense Refill  . albuterol (PROVENTIL HFA;VENTOLIN HFA) 108 (90 BASE) MCG/ACT inhaler Inhale 2 puffs into the lungs every 6 (six) hours as needed for wheezing or shortness of breath.    Marland Kitchen albuterol (PROVENTIL) (2.5 MG/3ML) 0.083% nebulizer solution USE 1 VIAL EVERY 4 HOURS AS NEEDED FOR SHORTNESS OF BREATH  Dx: J44.9 150 mL 0  . DULoxetine (CYMBALTA) 30 MG capsule TAKE 1 CAPSULE BY MOUTH TWICE A DAY    . Fluticasone-Umeclidin-Vilant (TRELEGY ELLIPTA) 100-62.5-25 MCG/INH AEPB Inhale 1 puff into the lungs daily at 8 pm. 60 each 5  . gabapentin (NEURONTIN) 300 MG capsule Take 600 mg by mouth at bedtime.    . hydrochlorothiazide (HYDRODIURIL) 25 MG tablet TAKE 1 TABLET BY MOUTH EVERY DAY    . HYDROcodone-acetaminophen (NORCO) 10-325 MG tablet Take 1 tablet by mouth 3 (three) times daily as needed  for moderate pain.     Marland Kitchen ipratropium-albuterol (DUONEB) 0.5-2.5 (3) MG/3ML SOLN Take 3 mLs by nebulization 3 (three) times daily. Dx; copd  j44.9 (Patient taking differently: Take 3 mLs by nebulization 3 (three) times daily as needed. Dx; copd  j44.9) 360 mL 0  . sertraline (ZOLOFT) 50 MG tablet Take one and a half pill daily    . traZODone (DESYREL) 50 MG tablet 25-50 mg at night as needed for sleep 30 tablet 1   No current facility-administered medications for this visit.     Neurologic: Headache: No Seizure: No Paresthesias:No  Musculoskeletal: Strength & Muscle Tone: within normal limits Gait & Station: normal Patient leans: N/A  Psychiatric Specialty Exam: Review of Systems  Respiratory: Positive for shortness of breath.   Neurological: Positive for sensory change.  Psychiatric/Behavioral: Positive for depression. Negative for hallucinations, memory loss, substance abuse and suicidal ideas. The patient is nervous/anxious and has insomnia.   All other systems reviewed and are negative.   Blood pressure (!) 148/74, pulse 86, height 6' (1.829 m), weight 224 lb (101.6 kg), SpO2 (!) 89 %.Body mass index is 30.38 kg/m.  General Appearance: Fairly Groomed  Eye Contact:  Good  Speech:  Clear and Coherent  Volume:  Normal  Mood:  Depressed  Affect:  Appropriate, Congruent and down at times, but reactive  Thought Process:  Coherent and Goal Directed  Orientation:  Full (Time, Place, and Person)  Thought Content:  Logical  Suicidal Thoughts:  No  Homicidal Thoughts:  No  Memory:  Immediate;   Good  Judgement:  Good  Insight:  Good  Psychomotor Activity:  Normal  Concentration:  Concentration: Good and Attention Span: Good  Recall:  Good  Fund of Knowledge:Good  Language: Good  Akathisia:  No  Handed:  Right  AIMS (  if indicated):  N/A  Assets:  Communication Skills Desire for Improvement  ADL's:  Intact  Cognition: WNL  Sleep:  Initial insomnia   Assessment Susan Laski  Frye is a 72 y.o. year old female with a history of depression, COPD, who is referred for depression.   # MDD, mild, recurrent without psychotic features Exam is notable for preserved affect reactivity, although patient reports mild neurovegetative symptoms.  Psychosocial stressors including issues with her daughter in law at home and demoralization due to medical condition. She also did have many losses in her family, including her husband of 28 years, although she denies significant grief at this time. Will uptitrate duloxetine to optimize effect for depression and neuropathic pain on foot. Discussed risk of hypertension. Will discontinue mirtazapine given adverse reaction of dry mouth. Will taper off sertraline to avoid polypharmacy. .Discussed risks, including, but not limited to serotonin syndrome and withdrawal symptoms. Will start trazodone prn for insomnia; discussed risk of drowsiness.   Plan 1. Increase duloxetine 60 mg daily, 30 mg qPM 2. Discontinue mirtazapine  3. Decrease sertraline 50 mg daily for one week, then 25 mg daily for one week, then discontinue 4. Start Trazodone 25 mg - 50 mg at night as needed for sleep 5. Return to clinic in one month for 30 mins  6. Try contacting senior center 7. Contact [place] office [phone] if any issues about medication, side effect (She is on Xanax 0.25 mg daily prn for anxiety)  The patient demonstrates the following risk factors for suicide: Chronic risk factors for suicide include: psychiatric disorder of depression and chronic pain. Acute risk factors for suicide include: unemployment. Protective factors for this patient include: positive social support, coping skills and hope for the future. Considering these factors, the overall suicide risk at this point appears to be low. Patient is appropriate for outpatient follow up.   Treatment Plan Summary: Plan as above   Norman Clay, MD 6/8/20192:10 PM

## 2017-12-29 ENCOUNTER — Ambulatory Visit (HOSPITAL_COMMUNITY): Payer: Self-pay | Admitting: Psychiatry

## 2017-12-29 ENCOUNTER — Ambulatory Visit (INDEPENDENT_AMBULATORY_CARE_PROVIDER_SITE_OTHER): Payer: Medicare Other | Admitting: Psychiatry

## 2017-12-29 ENCOUNTER — Encounter (HOSPITAL_COMMUNITY): Payer: Self-pay | Admitting: Psychiatry

## 2017-12-29 VITALS — BP 148/74 | HR 86 | Ht 72.0 in | Wt 224.0 lb

## 2017-12-29 DIAGNOSIS — G4700 Insomnia, unspecified: Secondary | ICD-10-CM | POA: Diagnosis not present

## 2017-12-29 DIAGNOSIS — Z631 Problems in relationship with in-laws: Secondary | ICD-10-CM

## 2017-12-29 DIAGNOSIS — Z87891 Personal history of nicotine dependence: Secondary | ICD-10-CM

## 2017-12-29 DIAGNOSIS — Z560 Unemployment, unspecified: Secondary | ICD-10-CM | POA: Diagnosis not present

## 2017-12-29 DIAGNOSIS — F419 Anxiety disorder, unspecified: Secondary | ICD-10-CM | POA: Diagnosis not present

## 2017-12-29 DIAGNOSIS — Z638 Other specified problems related to primary support group: Secondary | ICD-10-CM

## 2017-12-29 DIAGNOSIS — J449 Chronic obstructive pulmonary disease, unspecified: Secondary | ICD-10-CM

## 2017-12-29 DIAGNOSIS — F331 Major depressive disorder, recurrent, moderate: Secondary | ICD-10-CM | POA: Insufficient documentation

## 2017-12-29 DIAGNOSIS — G8929 Other chronic pain: Secondary | ICD-10-CM | POA: Diagnosis not present

## 2017-12-29 DIAGNOSIS — G629 Polyneuropathy, unspecified: Secondary | ICD-10-CM | POA: Diagnosis not present

## 2017-12-29 DIAGNOSIS — F1099 Alcohol use, unspecified with unspecified alcohol-induced disorder: Secondary | ICD-10-CM | POA: Diagnosis not present

## 2017-12-29 DIAGNOSIS — Z634 Disappearance and death of family member: Secondary | ICD-10-CM

## 2017-12-29 DIAGNOSIS — R450 Nervousness: Secondary | ICD-10-CM

## 2017-12-29 DIAGNOSIS — F330 Major depressive disorder, recurrent, mild: Secondary | ICD-10-CM

## 2017-12-29 MED ORDER — TRAZODONE HCL 50 MG PO TABS: ORAL_TABLET | ORAL | 1 refills | Status: DC

## 2017-12-29 NOTE — Patient Instructions (Addendum)
1. Increase duloxetine 60 mg daily, 30 mg qPM 2. Discontinue mirtazapine  3. Decrease sertraline 50 mg daily for one week, then 25 mg daily for one week, then discontinue 4. Start Trazodone 25 mg - 50 mg at night  5. Return to clinic in one month for 30 mins at [facility]  6. Try contacting senior center 7. Contact [facility], [phone] if any issues about medication, side effect

## 2018-01-23 NOTE — Progress Notes (Signed)
BH MD/PA/NP OP Progress Note  01/29/2018 3:48 PM Susan Frye  MRN:  [id number]  Chief Complaint:  Chief Complaint    Follow-up; Depression     HPI:  Patient presents late for follow-up appointment for depression.  She is started out stating that she does not want to talk about it.  She talks about her situation at house. She feels very stressed by her daughter-in-law and her son at home. They asked her to pay more, although she pays $ 800 every month. Her daughter in [place] is helping her to find nursing facility. She feels more depressed lately. She has passive SI feeling overwhelmed, although she denies any plan/intent. She has insomnia. She did not find trazodone to be helpful. She feels fatigue and has fair concentration. She feels anxious, tense at times. She denies panic attacks. She was able to be off mirtazapine, sertraline without any issues. She has not tried senior center due to transportation issues. She has not taken xanax since the last visit.    Wt Readings from Last 3 Encounters:  01/29/18 218 lb (98.9 kg)  12/29/17 224 lb (101.6 kg)  10/29/17 219 lb (99.3 kg)    Visit Diagnosis:    ICD-10-CM   1. 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 I have reviewed the history. No updates at this time.     Past Medical History:  Past Medical History:  Diagnosis Date  . Arthritis   . Cancer ([place])    skin  . COPD (chronic obstructive pulmonary disease) ([place])   . Depression   . 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 Hypertension   . Spinal stenosis     Past Surgical History:  Procedure Laterality Date  . COSMETIC SURGERY     eye cosmetic surgery    Family Psychiatric History: Please see initial evaluation for full details. I have reviewed the history. No updates at this time.     Family History:  Family History  Problem Relation Age of  Onset  . Emphysema Mother        smoked  . Lung cancer Mother        smoked  . Varicose Veins Mother   . Emphysema Father        smoked  . Heart disease Father   . Diabetes Father   . Hyperlipidemia Father   . Hypertension Father   . Heart attack Father   . Cancer - Prostate Father   . Asthma Sister   . Diabetes Sister   . Heart disease Sister   . Diabetes Brother   . Hypertension Brother   . Heart attack Daughter   . Cancer Son     Social History:  Social History   Socioeconomic History  . Marital status: Widowed    Spouse name: Not on file  . Number of children: 2  . Years of education: 74  . Highest education level: Not on file  Occupational History  . Occupation: Retired    Comment: Facilities manager Needs  . Financial resource strain: Not hard at all  . Food insecurity:    Worry: Never true    Inability: Never true  . Transportation needs:    Medical: No    Non-medical: No  Tobacco Use  . Smoking status: Former Smoker    Packs/day: 2.00    Years: 39.00    Pack years: 78.00  Types: Cigarettes    Last attempt to quit: 04/03/2004    Years since quitting: 13.8  . Smokeless tobacco: Never Used  Substance and Sexual Activity  . Alcohol use: Yes    Alcohol/week: 2.4 oz    Types: 4 Cans of beer per week    Comment: Social  . Drug use: No  . Sexual activity: Not on file  Lifestyle  . Physical activity:    Days per week: 0 days    Minutes per session: 0 min  . Stress: Very much  Relationships  . Social connections:    Talks on phone: More than three times a week    Gets together: More than three times a week    Attends religious service: Never    Active member of club or organization: No    Attends meetings of clubs or organizations: Never    Relationship status: Widowed  Other Topics Concern  . Not on file  Social History Narrative   Lives at home, widowed   Right-handed   Caffeine: 4 cups per day    Allergies:  Allergies  Allergen  Reactions  . Ace Inhibitors     Cough    Metabolic Disorder Labs: Lab Results  Component Value Date   HGBA1C 5.8 (H) 06/26/2015   MPG 120 06/26/2015   No results found for: PROLACTIN No results found for: CHOL, TRIG, HDL, CHOLHDL, VLDL, LDLCALC Lab Results  Component Value Date   TSH 0.794 05/07/2017   TSH 0.352 06/27/2015    Therapeutic Level Labs: No results found for: LITHIUM No results found for: VALPROATE No components found for:  CBMZ  Current Medications: Current Outpatient Medications  Medication Sig Dispense Refill  . albuterol (PROVENTIL HFA;VENTOLIN HFA) 108 (90 BASE) MCG/ACT inhaler Inhale 2 puffs into the lungs every 6 (six) hours as needed for wheezing or shortness of breath.    Marland Kitchen albuterol (PROVENTIL) (2.5 MG/3ML) 0.083% nebulizer solution USE 1 VIAL EVERY 4 HOURS AS NEEDED FOR SHORTNESS OF BREATH  Dx: J44.9 150 mL 0  . DULoxetine (CYMBALTA) 30 MG capsule TAKE 1 CAPSULE BY MOUTH TWICE A DAY    . Fluticasone-Umeclidin-Vilant (TRELEGY ELLIPTA) 100-62.5-25 MCG/INH AEPB Inhale 1 puff into the lungs daily at 8 pm. 60 each 5  . gabapentin (NEURONTIN) 300 MG capsule Take 600 mg by mouth at bedtime.    . hydrochlorothiazide (HYDRODIURIL) 25 MG tablet TAKE 1 TABLET BY MOUTH EVERY DAY    . HYDROcodone-acetaminophen (NORCO) 10-325 MG tablet Take 1 tablet by mouth 3 (three) times daily as needed for moderate pain.     Marland Kitchen ipratropium-albuterol (DUONEB) 0.5-2.5 (3) MG/3ML SOLN Take 3 mLs by nebulization 3 (three) times daily. Dx; copd  j44.9 (Patient taking differently: Take 3 mLs by nebulization 3 (three) times daily as needed. Dx; copd  j44.9) 360 mL 0   No current facility-administered medications for this visit.      Musculoskeletal: Strength & Muscle Tone: within normal limits Gait & Station: normal Patient leans: N/A  Psychiatric Specialty Exam: Review of Systems  Respiratory: Positive for shortness of breath.   Psychiatric/Behavioral: Positive for depression.  Negative for hallucinations, memory loss, substance abuse and suicidal ideas. The patient is nervous/anxious and has insomnia.   All other systems reviewed and are negative.   Blood pressure (!) 155/85, pulse 83, height 6' (1.829 m), weight 218 lb (98.9 kg), SpO2 91 %.Body mass index is 29.57 kg/m.  General Appearance: Fairly Groomed  Eye Contact:  Good  Speech:  Clear and Coherent  Volume:  Normal  Mood:  Depressed  Affect:  Appropriate, Congruent and slightly down  Thought Process:  Coherent  Orientation:  Full (Time, Place, and Person)  Thought Content: Logical   Suicidal Thoughts:  No  Homicidal Thoughts:  No  Memory:  Immediate;   Good  Judgement:  Good  Insight:  Fair  Psychomotor Activity:  Normal  Concentration:  Concentration: Good and Attention Span: Good  Recall:  Good  Fund of Knowledge: Good  Language: Good  Akathisia:  No  Handed:  Right  AIMS (if indicated): not done  Assets:  Communication Skills Desire for Improvement  ADL's:  Intact  Cognition: WNL  Sleep:  Poor   Screenings: PHQ2-9     PULMONARY REHAB CHRONIC OBSTRUCTIVE PULMONARY DISEASE from 03/10/2014 in [place] from 11/10/2013 in [facility]  PHQ-2 Total Score  1  3  PHQ-9 Total Score  1  10       Assessment and Plan:  KAZUKO CLEMENCE is a 72 y.o. year old female with a history of depression, COPD on oxygen, who presents for follow up appointment for Moderate episode of recurrent major depressive disorder ([place])  # MDD, moderate recurrent without psychotic features Patient reports slightly worsening neurovegetative symptoms in the context of discordance with her daughter-in-law at home.  She is also demoralized by her medical condition.  Other psychosocial stressors including loss of her husband of 62 years, although she denies significant grief lately.  Will continue duloxetine to  target depression and neuropathic pain on foot. Discussed risk of hypertension (patient states that her BP is usually lower). Will consider adjunctive treatment if any worsening in her neurovegetative symptoms.  Will discontinue trazodone given patient has limited benefit from this medication. Will not start other sleep aid at this time given it can cause respiratory suppression. She will greatly benefit from CBT.  Will make referral to IOP.   Plan 1. Continue duloxetine 60 mg daily, 30 mg qPM 2. Discontinue Trazodone 3. Return to clinic in one month for 30 mins 4. Referral to IOP (She is on Xanax 0.25 mg daily prn for anxiety, hast not taken it for a while)  The patient demonstrates the following risk factors for suicide: Chronic risk factors for suicide include: psychiatric disorder of depression and chronic pain. Acute risk factors for suicide include: unemployment. Protective factors for this patient include: positive social support, coping skills and hope for the future. Considering these factors, the overall suicide risk at this point appears to be low. Patient is appropriate for outpatient follow up.  The duration of this appointment visit was 30 minutes of face-to-face time with the patient.  Greater than 50% of this time was spent in counseling, explanation of  diagnosis, planning of further management, and coordination of care.  Norman Clay, MD 01/29/2018, 3:48 PM

## 2018-01-29 ENCOUNTER — Ambulatory Visit (INDEPENDENT_AMBULATORY_CARE_PROVIDER_SITE_OTHER): Payer: Medicare Other | Admitting: Psychiatry

## 2018-01-29 ENCOUNTER — Encounter (HOSPITAL_COMMUNITY): Payer: Self-pay | Admitting: Psychiatry

## 2018-01-29 VITALS — BP 155/85 | HR 83 | Ht 72.0 in | Wt 218.0 lb

## 2018-01-29 DIAGNOSIS — F331 Major depressive disorder, recurrent, moderate: Secondary | ICD-10-CM

## 2018-01-29 NOTE — Patient Instructions (Signed)
1. Continue duloxetine 60 mg daily, 30 mg qPM 2. Discontinue Trazodone 3. Return to clinic in one month for 30 mins 4. Referral to IOP

## 2018-01-31 ENCOUNTER — Telehealth (HOSPITAL_COMMUNITY): Payer: Self-pay | Admitting: Professional

## 2018-02-22 ENCOUNTER — Other Ambulatory Visit: Payer: Self-pay

## 2018-02-22 DIAGNOSIS — R230 Cyanosis: Secondary | ICD-10-CM

## 2018-03-04 ENCOUNTER — Telehealth: Payer: Self-pay | Admitting: Pulmonary Disease

## 2018-03-04 MED ORDER — FLUTICASONE-UMECLIDIN-VILANT 100-62.5-25 MCG/INH IN AEPB: 1.0000 | INHALATION_SPRAY | Freq: Every day | RESPIRATORY_TRACT | 11 refills | Status: AC

## 2018-03-04 MED ORDER — FLUTICASONE-UMECLIDIN-VILANT 100-62.5-25 MCG/INH IN AEPB: 1.0000 | INHALATION_SPRAY | Freq: Every day | RESPIRATORY_TRACT | 0 refills | Status: AC

## 2018-03-04 NOTE — Telephone Encounter (Signed)
Pt called stating she was in the "donut hole", Trelegy is now $148/month.  Per pharmacy, this medication is covered but is such a high copay d/t being in the donut hole.  Samples left up front for pt, as well as pt assistance application for trelegy.  Pt will come to office to pick up.  Nothing further needed at this time.

## 2018-04-03 ENCOUNTER — Telehealth: Payer: Self-pay | Admitting: Pulmonary Disease

## 2018-04-03 NOTE — Telephone Encounter (Signed)
Called and spoke with patient, advised that one sample and patient assistance forms have been placed up front.

## 2018-04-09 ENCOUNTER — Encounter: Payer: Medicare Other | Admitting: Vascular Surgery

## 2018-04-09 ENCOUNTER — Other Ambulatory Visit: Payer: Self-pay

## 2018-04-09 ENCOUNTER — Ambulatory Visit (HOSPITAL_COMMUNITY): Payer: Medicare Other

## 2018-04-09 DIAGNOSIS — I739 Peripheral vascular disease, unspecified: Secondary | ICD-10-CM

## 2018-04-09 DIAGNOSIS — R230 Cyanosis: Secondary | ICD-10-CM

## 2018-04-22 ENCOUNTER — Ambulatory Visit (HOSPITAL_COMMUNITY)
Admission: RE | Admit: 2018-04-22 | Discharge: 2018-04-22 | Disposition: A | Discharge status: Home / Self Care | Payer: Medicare Other | Source: Ambulatory Visit | Attending: Vascular Surgery | Admitting: Vascular Surgery

## 2018-04-22 ENCOUNTER — Encounter: Payer: Medicare Other | Admitting: Vascular Surgery

## 2018-04-22 ENCOUNTER — Ambulatory Visit (HOSPITAL_COMMUNITY): Payer: Medicare Other

## 2018-04-22 ENCOUNTER — Encounter: Payer: Self-pay | Admitting: Vascular Surgery

## 2018-04-22 ENCOUNTER — Ambulatory Visit (INDEPENDENT_AMBULATORY_CARE_PROVIDER_SITE_OTHER): Payer: Medicare Other | Admitting: Vascular Surgery

## 2018-04-22 VITALS — BP 150/81 | HR 69 | Temp 98.2°F | Resp 24 | Ht 71.0 in | Wt 224.0 lb

## 2018-04-22 DIAGNOSIS — I739 Peripheral vascular disease, unspecified: Secondary | ICD-10-CM | POA: Diagnosis present

## 2018-04-22 DIAGNOSIS — R932 Abnormal findings on diagnostic imaging of liver and biliary tract: Secondary | ICD-10-CM | POA: Diagnosis not present

## 2018-04-22 DIAGNOSIS — R230 Cyanosis: Secondary | ICD-10-CM

## 2018-04-22 NOTE — Progress Notes (Signed)
[facility] [place]  Patient name: Susan Frye MRN: [id number] DOB: 01-19-1946 Sex: female  REASON FOR CONSULT: Evaluation of cyanosis of both lower extremities  Seen today in our [place] office  HPI: Susan Frye is a 72 y.o. female, who is her today for evaluation of cyanosis in both lower extremities.  She had seen Dr. Bridgett Larsson in our office in 2015 for similar issues.  Main limitation continues to be shortness of breath with severe COPD.  She is on chronic oxygen usage.  She does walk with a walker due to her spinal stenosis.  She reports that there is been no improvement in this as well.  Does report peripheral neuropathy as well.  She did suffer excoriation over the dorsum of the toes of her right foot and reports that she fell out of bed.  There was blistering and this is resolving.  This was approximately 3 weeks ago.  Reports that her feet feel cold to her but no evidence of arterial rest pain.  She does not walk to the level of claudication.  She does have mild swelling but reports that this is not severe  Past Medical History:  Diagnosis Date  . Arthritis   . Cancer ([place])    skin  . COPD (chronic obstructive pulmonary disease) ([place])   . Depression   . 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 Hypertension   . Spinal stenosis     Family History  Problem Relation Age of Onset  . Emphysema Mother        smoked  . Lung cancer Mother        smoked  . Varicose Veins Mother   . Emphysema Father        smoked  . Heart disease Father   . Diabetes Father   . Hyperlipidemia Father   . Hypertension Father   . Heart attack Father   . Cancer - Prostate Father   . Asthma Sister   . Diabetes Sister   . Heart disease Sister   . Diabetes Brother   . Hypertension Brother   . Heart attack Daughter   . Cancer Son     SOCIAL HISTORY: Social History   Socioeconomic History  . Marital status:  Widowed    Spouse name: Not on file  . Number of children: 2  . Years of education: 20  . Highest education level: Not on file  Occupational History  . Occupation: Retired    Comment: Facilities manager Needs  . Financial resource strain: Not hard at all  . Food insecurity:    Worry: Never true    Inability: Never true  . Transportation needs:    Medical: No    Non-medical: No  Tobacco Use  . Smoking status: Former Smoker    Packs/day: 2.00    Years: 39.00    Pack years: 78.00    Types: Cigarettes    Last attempt to quit: 04/03/2004    Years since quitting: 14.0  . Smokeless tobacco: Never Used  Substance and Sexual Activity  . Alcohol use: Yes    Alcohol/week: 4.0 standard drinks    Types: 4 Cans of beer per week    Comment: Social  . Drug use: No  . Sexual activity: Not on file  Lifestyle  . Physical activity:    Days per week: 0 days    Minutes per session: 0 min  . Stress: Very much  Relationships  .  Social connections:    Talks on phone: More than three times a week    Gets together: More than three times a week    Attends religious service: Never    Active member of club or organization: No    Attends meetings of clubs or organizations: Never    Relationship status: Widowed  . Intimate partner violence:    Fear of current or ex partner: No    Emotionally abused: No    Physically abused: No    Forced sexual activity: No  Other Topics Concern  . Not on file  Social History Narrative   Lives at home, widowed   Right-handed   Caffeine: 4 cups per day    Allergies  Allergen Reactions  . Ace Inhibitors     Cough    Current Outpatient Medications  Medication Sig Dispense Refill  . albuterol (PROVENTIL HFA;VENTOLIN HFA) 108 (90 BASE) MCG/ACT inhaler Inhale 2 puffs into the lungs every 6 (six) hours as needed for wheezing or shortness of breath.    Marland Kitchen albuterol (PROVENTIL) (2.5 MG/3ML) 0.083% nebulizer solution USE 1 VIAL EVERY 4 HOURS AS NEEDED FOR  SHORTNESS OF BREATH  Dx: J44.9 150 mL 0  . DULoxetine (CYMBALTA) 30 MG capsule TAKE 1 CAPSULE BY MOUTH TWICE A DAY    . Fluticasone-Umeclidin-Vilant (TRELEGY ELLIPTA) 100-62.5-25 MCG/INH AEPB Inhale 1 puff into the lungs daily. 60 each 11  . Fluticasone-Umeclidin-Vilant (TRELEGY ELLIPTA) 100-62.5-25 MCG/INH AEPB Inhale 1 puff into the lungs daily at 8 pm. 2 each 0  . gabapentin (NEURONTIN) 300 MG capsule Take 600 mg by mouth at bedtime.    . hydrochlorothiazide (HYDRODIURIL) 25 MG tablet TAKE 1 TABLET BY MOUTH EVERY DAY    . HYDROcodone-acetaminophen (NORCO) 10-325 MG tablet Take 1 tablet by mouth 3 (three) times daily as needed for moderate pain.     Marland Kitchen ipratropium-albuterol (DUONEB) 0.5-2.5 (3) MG/3ML SOLN Take 3 mLs by nebulization 3 (three) times daily. Dx; copd  j44.9 (Patient taking differently: Take 3 mLs by nebulization 3 (three) times daily as needed. Dx; copd  j44.9) 360 mL 0   No current facility-administered medications for this visit.     REVIEW OF SYSTEMS:  [X]  denotes positive finding, [ ]  denotes negative finding Cardiac  Comments:  Chest pain or chest pressure:    Shortness of breath upon exertion: x   Short of breath when lying flat:    Irregular heart rhythm:        Vascular    Pain in calf, thigh, or hip brought on by ambulation: x   Pain in feet at night that wakes you up from your sleep:     Blood clot in your veins:    Leg swelling:         Pulmonary    Oxygen at home: x   Productive cough:     Wheezing:         Neurologic    Sudden weakness in arms or legs:     Sudden numbness in arms or legs:  x   Sudden onset of difficulty speaking or slurred speech:    Temporary loss of vision in one eye:     Problems with dizziness:         Gastrointestinal    Blood in stool:     Vomited blood:         Genitourinary    Burning when urinating:     Blood in urine:  Psychiatric    Major depression:  x       Hematologic    Bleeding problems:      Problems with blood clotting too easily:        Skin    Rashes or ulcers:        Constitutional    Fever or chills:      PHYSICAL EXAM: Vitals:   04/22/18 1325 04/22/18 1330  BP: 131/84 (!) 150/81  Pulse: 86 69  Resp: (!) 24   Temp: 98.2 F (36.8 C)   TempSrc: Temporal   Weight: 224 lb (101.6 kg)   Height: 5\' 11"  (1.803 m)     GENERAL: The patient is a well-nourished female, in no acute distress. The vital signs are documented above. CARDIOVASCULAR: Carotid arteries without bruits bilaterally.  2+ radial and 2+ dorsalis pedis pulses bilaterally PULMONARY: There is good air exchange  ABDOMEN: Soft and non-tender no bruits noted MUSCULOSKELETAL: There are no major deformities or cyanosis. NEUROLOGIC: No focal weakness or paresthesias are detected. SKIN: There are no ulcers or rashes noted. PSYCHIATRIC: The patient has a normal affect.  DATA:  Noninvasive studies from today reveal ankle arm index slightly diminished at 0.8 bilaterally with normal arterial waveforms bilaterally  MEDICAL ISSUES: I discussed this at length with the patient.  She has completely normal dorsalis pedis pulses bilaterally and mild reduction in her ankle arm index.  I feel that her cyanosis is not related to arterial insufficiency and probably related to her severe pulmonary disease.  She is not having any significant swelling so therefore have not recommended compression garments since it would be very difficult for her to apply these.  She has been able to heal the excoriation over the dorsum of her left foot.  I reassured her that it would be extremely unlikely to ever develop limb threatening difficulty with normal flow at this stage of life.  She was reassured with this discussion will see [place] again on an as-needed basis   Rosetta Posner, MD [facility] Tel [phone] Pager [phone]

## 2018-06-11 ENCOUNTER — Encounter: Payer: Medicare Other | Admitting: Vascular Surgery

## 2018-06-11 ENCOUNTER — Encounter (HOSPITAL_COMMUNITY): Payer: Medicare Other

## 2018-08-24 DEATH — deceased
